# Patient Record
Sex: Female | Born: 1990 | Race: White | Hispanic: Yes | Marital: Single | Smoking: Never smoker
Health system: Southern US, Community
[De-identification: ages and names within clinical notes are randomized; demographics above are authoritative.]

## PROBLEM LIST (undated history)

## (undated) ENCOUNTER — Inpatient Hospital Stay (HOSPITAL_COMMUNITY): Payer: Self-pay

## (undated) DIAGNOSIS — G43909 Migraine, unspecified, not intractable, without status migrainosus: Secondary | ICD-10-CM

## (undated) DIAGNOSIS — N871 Moderate cervical dysplasia: Secondary | ICD-10-CM

## (undated) DIAGNOSIS — R87610 Atypical squamous cells of undetermined significance on cytologic smear of cervix (ASC-US): Principal | ICD-10-CM

## (undated) DIAGNOSIS — I7300 Raynaud's syndrome without gangrene: Secondary | ICD-10-CM

## (undated) DIAGNOSIS — R87810 Cervical high risk human papillomavirus (HPV) DNA test positive: Principal | ICD-10-CM

## (undated) HISTORY — DX: Raynaud's syndrome without gangrene: I73.00

## (undated) HISTORY — DX: Cervical high risk human papillomavirus (HPV) DNA test positive: R87.810

## (undated) HISTORY — PX: NO PAST SURGERIES: SHX2092

## (undated) HISTORY — DX: Migraine, unspecified, not intractable, without status migrainosus: G43.909

## (undated) HISTORY — DX: Moderate cervical dysplasia: N87.1

## (undated) HISTORY — DX: Atypical squamous cells of undetermined significance on cytologic smear of cervix (ASC-US): R87.610

---
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
## 2013-10-18 ENCOUNTER — Ambulatory Visit: Payer: Self-pay | Attending: Internal Medicine

## 2013-11-15 ENCOUNTER — Other Ambulatory Visit (HOSPITAL_COMMUNITY)
Admission: RE | Admit: 2013-11-15 | Discharge: 2013-11-15 | Disposition: A | Discharge status: Home / Self Care | Payer: No Typology Code available for payment source | Source: Ambulatory Visit | Attending: Internal Medicine | Admitting: Internal Medicine

## 2013-11-15 ENCOUNTER — Ambulatory Visit: Payer: No Typology Code available for payment source | Attending: Internal Medicine | Admitting: Internal Medicine

## 2013-11-15 ENCOUNTER — Encounter: Payer: Self-pay | Admitting: Internal Medicine

## 2013-11-15 VITALS — BP 126/87 | HR 80 | Temp 99.0°F | Ht 67.0 in | Wt 135.4 lb

## 2013-11-15 DIAGNOSIS — N898 Other specified noninflammatory disorders of vagina: Secondary | ICD-10-CM

## 2013-11-15 DIAGNOSIS — Z01419 Encounter for gynecological examination (general) (routine) without abnormal findings: Secondary | ICD-10-CM | POA: Insufficient documentation

## 2013-11-15 DIAGNOSIS — Z113 Encounter for screening for infections with a predominantly sexual mode of transmission: Secondary | ICD-10-CM | POA: Insufficient documentation

## 2013-11-15 DIAGNOSIS — G43101 Migraine with aura, not intractable, with status migrainosus: Secondary | ICD-10-CM | POA: Insufficient documentation

## 2013-11-15 DIAGNOSIS — R109 Unspecified abdominal pain: Secondary | ICD-10-CM

## 2013-11-15 DIAGNOSIS — G43909 Migraine, unspecified, not intractable, without status migrainosus: Secondary | ICD-10-CM

## 2013-11-15 DIAGNOSIS — N760 Acute vaginitis: Secondary | ICD-10-CM | POA: Insufficient documentation

## 2013-11-15 LAB — TSH: TSH: 3.645 u[IU]/mL (ref 0.350–4.500)

## 2013-11-15 LAB — CBC WITH DIFFERENTIAL/PLATELET
Basophils Absolute: 0 10*3/uL (ref 0.0–0.1)
Eosinophils Absolute: 0.1 10*3/uL (ref 0.0–0.7)
Eosinophils Relative: 2 % (ref 0–5)
HCT: 42.9 % (ref 36.0–46.0)
Lymphocytes Relative: 26 % (ref 12–46)
Lymphs Abs: 2 10*3/uL (ref 0.7–4.0)
MCH: 30.7 pg (ref 26.0–34.0)
MCV: 87.9 fL (ref 78.0–100.0)
Monocytes Absolute: 0.6 10*3/uL (ref 0.1–1.0)
RDW: 13.3 % (ref 11.5–15.5)
WBC: 8 10*3/uL (ref 4.0–10.5)

## 2013-11-15 LAB — CMP AND LIVER
BUN: 13 mg/dL (ref 6–23)
Bilirubin, Direct: 0.1 mg/dL (ref 0.0–0.3)
CO2: 26 mEq/L (ref 19–32)
Calcium: 9.7 mg/dL (ref 8.4–10.5)
Chloride: 102 mEq/L (ref 96–112)
Creat: 0.82 mg/dL (ref 0.50–1.10)
Total Bilirubin: 0.5 mg/dL (ref 0.3–1.2)

## 2013-11-15 MED ORDER — SUMATRIPTAN SUCCINATE 25 MG PO TABS: 25.0000 mg | ORAL_TABLET | ORAL | Status: DC | PRN

## 2013-11-15 MED ORDER — METRONIDAZOLE 500 MG PO TABS: 2000.0000 mg | ORAL_TABLET | Freq: Once | ORAL | Status: DC

## 2013-11-15 MED ORDER — OMEPRAZOLE 20 MG PO CPDR: 20.0000 mg | DELAYED_RELEASE_CAPSULE | Freq: Every day | ORAL | Status: DC

## 2013-11-15 MED ORDER — FLUCONAZOLE 150 MG PO TABS: 150.0000 mg | ORAL_TABLET | Freq: Once | ORAL | Status: DC

## 2013-11-15 NOTE — Patient Instructions (Signed)
Migraine Headache A migraine headache is an intense, throbbing pain on one or both sides of your head. A migraine can last for 30 minutes to several hours. CAUSES  The exact cause of a migraine headache is not always known. However, a migraine may be caused when nerves in the brain become irritated and release chemicals that cause inflammation. This causes pain. SYMPTOMS  Pain on one or both sides of your head.  Pulsating or throbbing pain.  Severe pain that prevents daily activities.  Pain that is aggravated by any physical activity.  Nausea, vomiting, or both.  Dizziness.  Pain with exposure to bright lights, loud noises, or activity.  General sensitivity to bright lights, loud noises, or smells. Before you get a migraine, you may get warning signs that a migraine is coming (aura). An aura may include:  Seeing flashing lights.  Seeing bright spots, halos, or zig-zag lines.  Having tunnel vision or blurred vision.  Having feelings of numbness or tingling.  Having trouble talking.  Having muscle weakness. MIGRAINE TRIGGERS  Alcohol.  Smoking.  Stress.  Menstruation.  Aged cheeses.  Foods or drinks that contain nitrates, glutamate, aspartame, or tyramine.  Lack of sleep.  Chocolate.  Caffeine.  Hunger.  Physical exertion.  Fatigue.  Medicines used to treat chest pain (nitroglycerine), birth control pills, estrogen, and some blood pressure medicines. DIAGNOSIS  A migraine headache is often diagnosed based on:  Symptoms.  Physical examination.  A CT scan or MRI of your head. TREATMENT Medicines may be given for pain and nausea. Medicines can also be given to help prevent recurrent migraines.  HOME CARE INSTRUCTIONS  Only take over-the-counter or prescription medicines for pain or discomfort as directed by your caregiver. The use of long-term narcotics is not recommended.  Lie down in a dark, quiet room when you have a migraine.  Keep a journal  to find out what may trigger your migraine headaches. For example, write down:  What you eat and drink.  How much sleep you get.  Any change to your diet or medicines.  Limit alcohol consumption.  Quit smoking if you smoke.  Get 7 to 9 hours of sleep, or as recommended by your caregiver.  Limit stress.  Keep lights dim if bright lights bother you and make your migraines worse. SEEK IMMEDIATE MEDICAL CARE IF:   Your migraine becomes severe.  You have a fever.  You have a stiff neck.  You have vision loss.  You have muscular weakness or loss of muscle control.  You start losing your balance or have trouble walking.  You feel faint or pass out.  You have severe symptoms that are different from your first symptoms. MAKE SURE YOU:   Understand these instructions.  Will watch your condition.  Will get help right away if you are not doing well or get worse. Document Released: 12/16/2005 Document Revised: 03/09/2012 Document Reviewed: 12/06/2011 Exit[employer] Patient Information 2014 [employer].  

## 2013-11-15 NOTE — Progress Notes (Signed)
Patient ID: Barbara Mcfarland, female   DOB: 05-24-1991, 22 y.o.   MRN: [id number] Patient Demographics  Barbara Mcfarland, is a 22 y.o. female  [id number]:[id number]  [id number]:[id number]  DOB - 04-Oct-1991  CC:  Chief Complaint  Patient presents with  . Establish Care  . Headaches    x3 day since birth, migranes, blurred vision       HPI: Barbara Mcfarland is a 22 y.o. female here today to establish medical care. Patient complains of headache that has been going on since childhood. She said the headache is on and off, sometimes once in a month, throbbing, associated with photophobia, changes position, not related to her menstrual period. She also has abdominal pain on and off, worse, she is hungry. She has some vagina discharge with unusual odor. Claims she's not sexually active. She does not smoke cigarette, she does not drink alcohol.  Patient has No headache, No chest pain, No abdominal pain - No Nausea, No new weakness tingling or numbness, No Cough - SOB.  Not on File No past medical history on file. No current outpatient prescriptions on file prior to visit.   No current facility-administered medications on file prior to visit.   No family history on file. History   Social History  . Marital Status: Single    Spouse Name: N/A    Number of Children: N/A  . Years of Education: N/A   Occupational History  . Not on file.   Social History Main Topics  . Smoking status: Never Smoker   . Smokeless tobacco: Not on file  . Alcohol Use: Not on file  . Drug Use: Not on file  . Sexual Activity: Not on file   Other Topics Concern  . Not on file   Social History Narrative  . No narrative on file    Review of Systems: Constitutional: Negative for fever, chills, diaphoresis, activity change, appetite change and fatigue. HENT: Negative for ear pain, nosebleeds, congestion, facial swelling, rhinorrhea, neck pain, neck stiffness and ear discharge.  Eyes: Negative for pain, discharge, redness, itching and  visual disturbance. Respiratory: Negative for cough, choking, chest tightness, shortness of breath, wheezing and stridor.  Cardiovascular: Negative for chest pain, palpitations and leg swelling. Gastrointestinal: Negative for abdominal distention. Genitourinary: Negative for dysuria, urgency, frequency, hematuria, flank pain, decreased urine volume, difficulty urinating and dyspareunia.  Musculoskeletal: Negative for back pain, joint swelling, arthralgia and gait problem. Neurological: Negative for dizziness, tremors, seizures, syncope, facial asymmetry, speech difficulty, weakness, light-headedness, numbness and headaches.  Hematological: Negative for adenopathy. Does not bruise/bleed easily. Psychiatric/Behavioral: Negative for hallucinations, behavioral problems, confusion, dysphoric mood, decreased concentration and agitation.    Objective:   Filed Vitals:   11/15/13 1414  BP: 126/87  Pulse: 80  Temp: 99 F (37.2 C)    Physical Exam: Constitutional: Patient appears well-developed and well-nourished. No distress. HENT: Normocephalic, atraumatic, External right and left ear normal. Oropharynx is clear and moist.  Eyes: Conjunctivae and EOM are normal. PERRLA, no scleral icterus. Neck: Normal ROM. Neck supple. No JVD. No tracheal deviation. No thyromegaly. CVS: RRR, S1/S2 +, no murmurs, no gallops, no carotid bruit.  Pulmonary: Effort and breath sounds normal, no stridor, rhonchi, wheezes, rales.  Abdominal: Soft. BS +, no distension, tenderness, rebound or guarding.  Musculoskeletal: Normal range of motion. No edema and no tenderness.  Lymphadenopathy: No lymphadenopathy noted, cervical, inguinal or axillary Neuro: Alert. Normal reflexes, muscle tone coordination. No cranial nerve deficit. Skin: Skin is warm and dry. No rash noted.  Not diaphoretic. No erythema. No pallor. Psychiatric: Normal mood and affect. Behavior, judgment, thought content normal.  Lab Results  Component  Value Date   WBC 6.7 07/15/2008   HGB 14.4 07/15/2008   HCT 42.5 07/15/2008   MCV 89.3 07/15/2008   PLT 227 07/15/2008   Lab Results  Component Value Date   CREATININE 0.58 07/15/2008   BUN 12 07/15/2008   NA 140 07/15/2008   K 3.9 07/15/2008   CL 103 07/15/2008   CO2 21 07/15/2008    No results found for this basename: HGBA1C   Lipid Panel  No results found for this basename: chol, trig, hdl, cholhdl, vldl, ldlcalc       Assessment and plan:   Patient Active Problem List   Diagnosis Date Noted  . Migraine headache 11/15/2013  . Abdominal pain, unspecified site 11/15/2013  . Vaginal discharge 11/15/2013    Plan: Pap smear done and sent for cytology and ancillary tests  We will sumatriptan 25 mg tablet by mouth once, repeat in 2 hours only. Prilosec 20 mg capsule by mouth daily Empiric treatment for bacterial vaginosis with metronidazole tablet 2 g once by mouth Empiric treatment for vaginal candidiasis with Diflucan 150 mg tablet by mouth once  Patient extensively counseled about nutrition and exercise   Follow up in 4 weeks or when necessary   The patient was given clear instructions to go to ER or return to medical center if symptoms don't improve, worsen or new problems develop. The patient verbalized understanding. The patient was told to call to get lab results if they haven't heard anything in the next week.     Barbara Lewandowsky, MD, MHA, FACP, FAAP [facility] [place], [place] [phone]   11/15/2013, 2:58 PM

## 2013-11-15 NOTE — Progress Notes (Signed)
Pt is here to establish care. Pt is complaining of migraine headaches x3 day since birth; causing blurred vision.

## 2013-11-16 LAB — URINALYSIS, COMPLETE
Hgb urine dipstick: NEGATIVE
Leukocytes, UA: NEGATIVE
Nitrite: NEGATIVE
Protein, ur: NEGATIVE mg/dL
Urobilinogen, UA: 0.2 mg/dL (ref 0.0–1.0)

## 2013-12-03 ENCOUNTER — Ambulatory Visit: Payer: No Typology Code available for payment source | Attending: Internal Medicine | Admitting: Internal Medicine

## 2013-12-03 VITALS — BP 134/69 | HR 77 | Temp 98.8°F | Resp 14 | Ht 67.0 in | Wt 139.6 lb

## 2013-12-03 DIAGNOSIS — G43909 Migraine, unspecified, not intractable, without status migrainosus: Secondary | ICD-10-CM | POA: Insufficient documentation

## 2013-12-03 NOTE — Progress Notes (Signed)
Patient ID: Barbara Mcfarland, female   DOB: December 25, 1991, 22 y.o.   MRN: [id number]   CC:  HPI: 22 year old female who is here for followup of her migraine headaches. She has been using sumatriptan with good response and she has not had any headaches for a long time She is concerned about not being able to get pregnant. She states that her menstrual cycle is regular. Her Pap smear was also negative and the results of the Pap smear would discussed with her She also was treated for vaginitis, most of her symptoms have resolved she denies any foul-smelling discharge or any vaginal itching     Not on File No past medical history on file. Current Outpatient Prescriptions on File Prior to Visit  Medication Sig Dispense Refill  . metroNIDAZOLE (FLAGYL) 500 MG tablet Take 4 tablets (2,000 mg total) by mouth once.  4 tablet  0  . omeprazole (PRILOSEC) 20 MG capsule Take 1 capsule (20 mg total) by mouth daily.  30 capsule  3  . fluconazole (DIFLUCAN) 150 MG tablet Take 1 tablet (150 mg total) by mouth once.  1 tablet  0  . SUMAtriptan (IMITREX) 25 MG tablet Take 1 tablet (25 mg total) by mouth every 2 (two) hours as needed for migraine or headache. May repeat in 2 hours if headache persists or recurs.  10 tablet  0   No current facility-administered medications on file prior to visit.   No family history on file. History   Social History  . Marital Status: Single    Spouse Name: N/A    Number of Children: N/A  . Years of Education: N/A   Occupational History  . Not on file.   Social History Main Topics  . Smoking status: Never Smoker   . Smokeless tobacco: Not on file  . Alcohol Use: Not on file  . Drug Use: Not on file  . Sexual Activity: Not on file   Other Topics Concern  . Not on file   Social History Narrative  . No narrative on file    Review of Systems  Constitutional: Negative for fever, chills, diaphoresis, activity change, appetite change and fatigue.  HENT: Negative for ear  pain, nosebleeds, congestion, facial swelling, rhinorrhea, neck pain, neck stiffness and ear discharge.   Eyes: Negative for pain, discharge, redness, itching and visual disturbance.  Respiratory: Negative for cough, choking, chest tightness, shortness of breath, wheezing and stridor.   Cardiovascular: Negative for chest pain, palpitations and leg swelling.  Gastrointestinal: Negative for abdominal distention.  Genitourinary: Negative for dysuria, urgency, frequency, hematuria, flank pain, decreased urine volume, difficulty urinating and dyspareunia.  Musculoskeletal: Negative for back pain, joint swelling, arthralgias and gait problem.  Neurological: Negative for dizziness, tremors, seizures, syncope, facial asymmetry, speech difficulty, weakness, light-headedness, numbness and headaches.  Hematological: Negative for adenopathy. Does not bruise/bleed easily.  Psychiatric/Behavioral: Negative for hallucinations, behavioral problems, confusion, dysphoric mood, decreased concentration and agitation.    Objective:   Filed Vitals:   12/03/13 1422  BP: 134/69  Pulse: 77  Temp: 98.8 F (37.1 C)  Resp: 14    Physical Exam  Constitutional: Appears well-developed and well-nourished. No distress.  HENT: Normocephalic. External right and left ear normal. Oropharynx is clear and moist.  Eyes: Conjunctivae and EOM are normal. PERRLA, no scleral icterus.  Neck: Normal ROM. Neck supple. No JVD. No tracheal deviation. No thyromegaly.  CVS: RRR, S1/S2 +, no murmurs, no gallops, no carotid bruit.  Pulmonary: Effort and breath sounds normal,  no stridor, rhonchi, wheezes, rales.  Abdominal: Soft. BS +,  no distension, tenderness, rebound or guarding.  Musculoskeletal: Normal range of motion. No edema and no tenderness.  Lymphadenopathy: No lymphadenopathy noted, cervical, inguinal. Neuro: Alert. Normal reflexes, muscle tone coordination. No cranial nerve deficit. Skin: Skin is warm and dry. No rash  noted. Not diaphoretic. No erythema. No pallor.  Psychiatric: Normal mood and affect. Behavior, judgment, thought content normal.   Lab Results  Component Value Date   WBC 8.0 11/15/2013   HGB 15.0 11/15/2013   HCT 42.9 11/15/2013   MCV 87.9 11/15/2013   PLT 280 11/15/2013   Lab Results  Component Value Date   CREATININE 0.82 11/15/2013   BUN 13 11/15/2013   NA 136 11/15/2013   K 4.1 11/15/2013   CL 102 11/15/2013   CO2 26 11/15/2013    No results found for this basename: HGBA1C   Lipid Panel  No results found for this basename: chol, trig, hdl, cholhdl, vldl, ldlcalc       Assessment and plan:   Patient Active Problem List   Diagnosis Date Noted  . Migraine headache 11/15/2013  . Abdominal pain, unspecified site 11/15/2013  . Vaginal discharge 11/15/2013       Vaginitis-resolved  Migraine headaches improved  Gynecologic referral for inability to get pregnant We'll check TSH and prolactin level  Can followup in 9 months to one year, routine followup  The patient was given clear instructions to go to ER or return to medical center if symptoms don't improve, worsen or new problems develop. The patient verbalized understanding. The patient was told to call to get any lab results if not heard anything in the next week.

## 2013-12-03 NOTE — Progress Notes (Signed)
Pt is here for a 4 week f/u from last visit. Requesting results from that visit. Medication for migraines has helped a lot.

## 2013-12-04 LAB — PROLACTIN: Prolactin: 15 ng/mL

## 2013-12-13 ENCOUNTER — Ambulatory Visit: Payer: No Typology Code available for payment source | Admitting: Internal Medicine

## 2014-07-06 ENCOUNTER — Other Ambulatory Visit: Payer: Self-pay | Admitting: Internal Medicine

## 2015-02-10 ENCOUNTER — Ambulatory Visit: Payer: Self-pay | Attending: Internal Medicine

## 2015-05-05 ENCOUNTER — Ambulatory Visit: Payer: Self-pay | Attending: Internal Medicine | Admitting: Internal Medicine

## 2015-05-05 ENCOUNTER — Encounter: Payer: Self-pay | Admitting: Internal Medicine

## 2015-05-05 VITALS — BP 132/93 | HR 83 | Temp 98.2°F | Resp 16 | Ht 66.0 in | Wt 147.0 lb

## 2015-05-05 DIAGNOSIS — L679 Hair color and hair shaft abnormality, unspecified: Secondary | ICD-10-CM

## 2015-05-05 DIAGNOSIS — G43009 Migraine without aura, not intractable, without status migrainosus: Secondary | ICD-10-CM | POA: Insufficient documentation

## 2015-05-05 DIAGNOSIS — L659 Nonscarring hair loss, unspecified: Secondary | ICD-10-CM | POA: Insufficient documentation

## 2015-05-05 MED ORDER — SUMATRIPTAN SUCCINATE 25 MG PO TABS: 25.0000 mg | ORAL_TABLET | Freq: Three times a day (TID) | ORAL | Status: DC | PRN

## 2015-05-05 MED ORDER — TOPIRAMATE 25 MG PO TABS: 25.0000 mg | ORAL_TABLET | Freq: Every day | ORAL | Status: DC

## 2015-05-05 NOTE — Progress Notes (Signed)
Patient ID: Barbara Mcfarland, female   DOB: 01/30/1991, 24 y.o.   MRN: [id number]  CC: hair changes   HPI: Barbara Mcfarland is a 24 y.o. female here today for a follow up visit.  Patient has no significant past medical history and is not on any current medications. She presents for evaluation of hair loss and grey hair. She has noticed this problem over the past 2-3 months. She states that in the spots that she has hair loss, it grows back grey. The hair loss patch starts outs as small bumps that were pruritic and then the hair loss shortly follows. She states that in the past few months she was given fluconazole for tinea versicolor and believes the hair loss may be related to that.   Her brother suffered hair loss at 7428 but it grew back its natural color. She denies any early greying of her family members.   Patient has No chest pain, No abdominal pain - No Nausea, No new weakness tingling or numbness, No Cough - SOB.  No Known Allergies History reviewed. No pertinent past medical history. No current outpatient prescriptions on file prior to visit.   No current facility-administered medications on file prior to visit.   History reviewed. No pertinent family history. History   Social History  . Marital Status: Single    Spouse Name: N/A  . Number of Children: N/A  . Years of Education: N/A   Occupational History  . Not on file.   Social History Main Topics  . Smoking status: Never Smoker   . Smokeless tobacco: Not on file  . Alcohol Use: Not on file  . Drug Use: Not on file  . Sexual Activity: Not on file   Other Topics Concern  . Not on file   Social History Narrative    Review of Systems: See HPI   Objective:   Filed Vitals:   05/05/15 1015  BP: 132/93  Pulse: 83  Temp: 98.2 F (36.8 C)  Resp: 16    Physical Exam  Constitutional: She is oriented to person, place, and time.  HENT:  Head:    Eyes: EOM are normal. Pupils are equal, round, and reactive to light.    Cardiovascular: Normal rate and regular rhythm.   Pulmonary/Chest: Effort normal and breath sounds normal.  Neurological: She is alert and oriented to person, place, and time. No cranial nerve deficit.  '  Lab Results  Component Value Date   WBC 8.0 11/15/2013   HGB 15.0 11/15/2013   HCT 42.9 11/15/2013   MCV 87.9 11/15/2013   PLT 280 11/15/2013   Lab Results  Component Value Date   CREATININE 0.82 11/15/2013   BUN 13 11/15/2013   NA 136 11/15/2013   K 4.1 11/15/2013   CL 102 11/15/2013   CO2 26 11/15/2013    No results found for: HGBA1C Lipid Panel  No results found for: CHOL, TRIG, HDL, CHOLHDL, VLDL, LDLCALC     Assessment and plan:  Ellwood HandlerSaray was seen today for establish care.  Diagnoses and all orders for this visit:  Hair loss Orders: -     Ambulatory referral to Dermatology Patches of hair appear to have once been a fungal infection but does not explain the color changes  Hair changes Orders: -     Ambulatory referral to Dermatology  Migraine without aura and without status migrainosus, not intractable Orders: -     Begin SUMAtriptan (IMITREX) 25 MG tablet; Take 1 tablet (25 mg total)  by mouth every 8 (eight) hours as needed for migraine or headache. May repeat in 2 hours if headache persists or recurs. -     Begin topiramate (TOPAMAX) 25 MG tablet; Take 1 tablet (25 mg total) by mouth at bedtime. Patient will complete headache diary and bring back to follow up visit. At that time I plan to increase topamax to BID if needed.   Return in about 6 weeks (around 06/16/2015) for migraines.       Holland CommonsKECK, VALERIE, NP-C [facility] [phone] 05/05/2015, 10:32 AM

## 2015-05-05 NOTE — Progress Notes (Signed)
Pt is here b/c she has been loosing hair and turning grey.

## 2016-02-20 ENCOUNTER — Encounter: Payer: Self-pay | Admitting: Internal Medicine

## 2016-02-20 ENCOUNTER — Ambulatory Visit (INDEPENDENT_AMBULATORY_CARE_PROVIDER_SITE_OTHER): Payer: Self-pay | Admitting: Internal Medicine

## 2016-02-20 VITALS — BP 124/76 | HR 66 | Ht 66.0 in | Wt 143.0 lb

## 2016-02-20 DIAGNOSIS — I7300 Raynaud's syndrome without gangrene: Secondary | ICD-10-CM

## 2016-02-20 DIAGNOSIS — G43809 Other migraine, not intractable, without status migrainosus: Secondary | ICD-10-CM

## 2016-02-20 DIAGNOSIS — M76891 Other specified enthesopathies of right lower limb, excluding foot: Secondary | ICD-10-CM

## 2016-02-20 DIAGNOSIS — G43009 Migraine without aura, not intractable, without status migrainosus: Secondary | ICD-10-CM

## 2016-02-20 DIAGNOSIS — M65851 Other synovitis and tenosynovitis, right thigh: Secondary | ICD-10-CM

## 2016-02-20 DIAGNOSIS — Z23 Encounter for immunization: Secondary | ICD-10-CM

## 2016-02-20 HISTORY — DX: Raynaud's syndrome without gangrene: I73.00

## 2016-02-20 MED ORDER — VERAPAMIL HCL 80 MG PO TABS: ORAL_TABLET | ORAL | Status: DC

## 2016-02-20 MED ORDER — SUMATRIPTAN SUCCINATE 50 MG PO TABS: ORAL_TABLET | ORAL | Status: DC

## 2016-02-20 NOTE — Progress Notes (Signed)
Subjective:    Patient ID: Barbara Mcfarland, female    DOB: 1991/10/30, 25 y.o.   MRN: [id number]  HPI  1.  Pain like someone is pulling my tendon from left "butt cheek".  If leg locked and turning a bit to right, feels the pull and then the pain.  When sits in a hard seat, feels like leg is falling asleep.  This has been a problem for 6-7 months.  States she had started working out a while before the pain began. Works at [employer] and is walking around a lot, bending to pack parts in tubs, etc.  Causes her pain.  Has been working there beginning of October.  Already had pain, does not feel any worse than before she started there. Does not feel weakness in the leg or foot.  2.  Headaches:  Has had since a child.  Can be frontal, nuchal, temporal.  Generally starts on the left side, but can be on the right.  Feels like someone is hitting her in the head with a hammer.   No definite aura, but can feel light headed before a headache starts. + Phonophobia and Photophobia. + Nausea and vomiting when bad.   Sumatriptan 25 mg just puts her to sleep, but may be confusing with Topamax, which she only uses when she has a headache at night.  Was not aware she should take every night.   Has never taken every night.  Has a headache daily.  Feels she gets migraines 4 days out of 5.  Gets them on weekends also.    Has a lot of stressors:  Mom somewhat controlling/strict.  She is not allowed to leave the home until she gets married.  Her older brothers have the same rules.  Patient is second from youngest.  Sister is Barbara Mcfarland, a patient here.  Rutherford Nail is the youngest.  Feels she is living in a bubble.  Mother tells her to "forget about her"  If she talks about moving out on her own.  Does have a friend she can room with.   Mother requires her to take her sister wherever she goes for errands or for fun Mother is a patient here as well.  3.  ?Raynaud's Phenomenon:  Fingers get cold and  purplish for 3-4 years.  Cold rainy days or cooler or if holds a cold drink.  Never sees fingers blanche white, but when they warm up, they turn beefy red.   4.  Fatigued:  Exercises at 9:30 p.m. And then cannot fall asleep until 12 or 1 a.m.  Gets up at 9:30 am.  Works until 7:30 pm  Wants to bring up heartburn--cover at next visit.  Med:   Sumatriptan 25 mg 1 tab as needed for Headache:  Currently only taking at night when has headache?--confused regarding meds  Tompiramate 25 mg only taking 1 tab at bedtime when has a headache.  Allergies:  NKDA      Review of Systems     Objective:   Physical Exam   NAD HEENT:  PERRL, EOMI, discs sharp, TMs pearly gray, throat without injection Neck:  Supple, No adenopathy, no thyromegaly Chest:  CTA CV:  RRR with normal S1 and S2, No S3, S4 or murmur.  Radial and DP pulse normal and equal Abd:  S, NT, +BS, No HSM or mass Extrems:  Fingertips cool and clammy with mild cyanosis at base of nails.  No loss of tissue distally. MS:  Point tenderness on ischial tuberosity, left.  Reproduces discomfort down leg. Not tender to lateral aspect of tuberosity Negative straight leg raise.  Stretch of hamstrings is felt in same area. Neuro: A & O x3, CN II-XII grossly intact, DTRs 2+/4 throughout, Motor is 5/5 throughout, sensory grossly normal when stands, initial mild limp.           Assessment & Plan:  1.  Ischial tuberosity bursitis/Hamstring tendintis:  PT, exercises given.  Needs to get orange card for PT.  2.  Migraines: Almost daily by history.  Start Verapamil 40 mg twice daily--consider going back to Topiramate and taking regularly if does not tolerate. BP check in 1 week.  Increase Sumatriptan to 50 mg for a migraine with max of 200 mg in a day. Discussed not waiting until her pain is significant  3.  Raynaud's Phenomenon:  May get some benefit from Verapamil, though not a peripherally acting Calcium channel blocker.  To keep her core  warm, not just her hands.

## 2016-02-20 NOTE — Patient Instructions (Addendum)
Please keep track of your headaches--write down on a calendar when you have one and for how long, and what you took to control  Stop Topiramate for now--do not throw out  Stretches twice daily of left hamstrings--10 reps and hold for 10 counts

## 2016-02-29 ENCOUNTER — Other Ambulatory Visit: Payer: Self-pay | Admitting: Licensed Clinical Social Worker

## 2016-03-01 ENCOUNTER — Ambulatory Visit: Payer: Self-pay

## 2016-03-01 VITALS — BP 108/70 | HR 62

## 2016-03-01 DIAGNOSIS — G43809 Other migraine, not intractable, without status migrainosus: Secondary | ICD-10-CM

## 2016-04-25 ENCOUNTER — Ambulatory Visit: Payer: Self-pay | Admitting: Internal Medicine

## 2016-12-30 NOTE — L&D Delivery Note (Addendum)
26 y.o. G1P0 at 4711w4d delivered a viable female infant in cephalic, OA position with moderate meconium stained fluid. No nuchal cord. Anterior shoulder delivered with ease. 60 sec delayed cord clamping. Cord clamped x2 and cut. Placenta delivered spontaneously intact, with 3VC. Fundus firm on exam with massage and pitocin. Good hemostasis noted.  Anesthesia: None; Lidocaine for local anesthesia Laceration: 3rd degree, type 3C (complete tear of EAS), repaired in typical fashion. Suture: 2-0 Monocryl, 3-0 Vicryl Good hemostasis noted. EBL: 400cc  Mom and baby recovering in LDR.    Apgars: APGAR (1 MIN): 9   APGAR (5 MINS): 9     Weight: Pending skin to skin  Sponge and instrument count were correct x2. Placenta sent to L&D.  Dr. Nettie ElmMichael Ervin present for entire 3rd degree repair.   Barbara MowElizabeth Amil Bouwman, DO OB Fellow [facility] 06/21/2017, 9:46 AM

## 2017-01-02 LAB — OB RESULTS CONSOLE RUBELLA ANTIBODY, IGM: RUBELLA: IMMUNE

## 2017-01-02 LAB — OB RESULTS CONSOLE GC/CHLAMYDIA
CHLAMYDIA, DNA PROBE: NEGATIVE
GC PROBE AMP, GENITAL: NEGATIVE

## 2017-01-02 LAB — OB RESULTS CONSOLE RPR: RPR: NONREACTIVE

## 2017-01-02 LAB — OB RESULTS CONSOLE HEPATITIS B SURFACE ANTIGEN: Hepatitis B Surface Ag: NEGATIVE

## 2017-01-02 LAB — OB RESULTS CONSOLE HIV ANTIBODY (ROUTINE TESTING): HIV: NONREACTIVE

## 2017-01-21 ENCOUNTER — Encounter (HOSPITAL_COMMUNITY): Payer: Self-pay | Admitting: *Deleted

## 2017-01-21 ENCOUNTER — Inpatient Hospital Stay (HOSPITAL_COMMUNITY)
Admission: AD | Admit: 2017-01-21 | Discharge: 2017-01-21 | Disposition: A | Discharge status: Home / Self Care | Payer: Self-pay | Source: Ambulatory Visit | Attending: Family Medicine | Admitting: Family Medicine

## 2017-01-21 DIAGNOSIS — O2342 Unspecified infection of urinary tract in pregnancy, second trimester: Secondary | ICD-10-CM | POA: Insufficient documentation

## 2017-01-21 DIAGNOSIS — Z3A19 19 weeks gestation of pregnancy: Secondary | ICD-10-CM | POA: Insufficient documentation

## 2017-01-21 LAB — URINALYSIS, ROUTINE W REFLEX MICROSCOPIC
Bilirubin Urine: NEGATIVE
GLUCOSE, UA: NEGATIVE mg/dL
Ketones, ur: NEGATIVE mg/dL
Nitrite: NEGATIVE
PROTEIN: NEGATIVE mg/dL
Specific Gravity, Urine: 1.002 — ABNORMAL LOW (ref 1.005–1.030)
pH: 7 (ref 5.0–8.0)

## 2017-01-21 LAB — WET PREP, GENITAL
CLUE CELLS WET PREP: NONE SEEN
Sperm: NONE SEEN
TRICH WET PREP: NONE SEEN
Yeast Wet Prep HPF POC: NONE SEEN

## 2017-01-21 MED ORDER — NITROFURANTOIN MONOHYD MACRO 100 MG PO CAPS: 100.0000 mg | ORAL_CAPSULE | Freq: Two times a day (BID) | ORAL | 0 refills | Status: DC

## 2017-01-21 NOTE — Discharge Instructions (Signed)
Pregnancy and Urinary Tract Infection °WHAT IS A URINARY TRACT INFECTION? °A urinary tract infection (UTI) is an infection of any part of the urinary tract. This includes the kidneys, the tubes that connect your kidneys to your bladder (ureters), the bladder, and the tube that carries urine out of your body (urethra). These organs make, store, and get rid of urine in the body. A UTI can be a bladder infection (cystitis) or a kidney infection (pyelonephritis). This infection may be caused by fungi, viruses, and bacteria. Bacteria are the most common cause of UTIs. °You are more likely to develop a UTI during pregnancy because: °· The physical and hormonal changes your body goes through can make it easier for bacteria to get into your urinary tract. °· Your growing baby puts pressure on your uterus and can affect urine flow. °DOES A UTI PLACE MY BABY AT RISK? °An untreated UTI during pregnancy could lead to a kidney infection, which can cause health problems that could affect your baby. Possible complications of an untreated UTI include: °· Having your baby before 37 weeks of pregnancy (premature). °· Having a baby with a low birth weight. °· Developing high blood pressure during pregnancy (preeclampsia). °WHAT ARE THE SYMPTOMS OF A UTI? °Symptoms of a UTI include: °· Fever. °· Frequent urination or passing small amounts of urine frequently. °· Needing to urinate urgently. °· Pain or a burning sensation with urination. °· Urine that smells bad or unusual. °· Cloudy urine. °· Pain in the lower abdomen or back. °· Trouble urinating. °· Blood in the urine. °· Vomiting or being less hungry than normal. °· Diarrhea or abdominal pain. °· Vaginal discharge. °WHAT ARE THE TREATMENT OPTIONS FOR A UTI DURING PREGNANCY? °Treatment for this condition may include: °· Antibiotic medicines that are safe to take during pregnancy. °· Other medicines to treat less common causes of UTI. °HOW CAN I PREVENT A UTI? °To prevent a UTI: °· Go  to the bathroom as soon as you feel the need. °· Always wipe from front to back. °· Wash your genital area with soap and warm water daily. °· Empty your bladder before and after sex. °· Wear cotton underwear. °· Limit your intake of high sugar foods or drinks, such as regular soda, juice, and sweets.. °· Drink 6-8 glasses of water daily. °· Do not wear tight-fitting pants. °· Do not douche or use deodorant sprays. °· Do not drink alcohol, caffeine, or carbonated drinks. These can irritate the bladder. °WHEN SHOULD I SEEK MEDICAL CARE? °Seek medical care if: °· Your symptoms do not improve or get worse. °· You have a fever after two days of treatment. °· You have a rash. °· You have abnormal vaginal discharge. °· You have back or side pain. °· You have chills. °· You have nausea and vomiting. °WHEN SHOULD I SEEK IMMEDIATE MEDICAL CARE? °Seek immediate medical care if you are pregnant and: °· You feel contractions in your uterus. °· You have lower belly pain. °· You have a gush of fluid from your vagina. °· You have blood in your urine. °· You are vomiting and cannot keep down any medicines or water. °This information is not intended to replace advice given to you by your health care provider. Make sure you discuss any questions you have with your health care provider. °Document Released: 04/12/2011 Document Revised: 05/20/2016 Document Reviewed: 11/06/2015 °Elsevier Interactive Patient Education © 2017 [employer] ° °

## 2017-01-21 NOTE — MAU Note (Signed)
Pt reports lower back pain that started at 1730, pain was really bad to start with but has continued. Pt states she is 19 weeks preg and [facility]. Denies bleeding , dysuria.

## 2017-01-21 NOTE — MAU Provider Note (Signed)
History     CSN: [id number]  Arrival date and time: 01/21/17 1957   None     No chief complaint on file.  G1 @19  weeks here with lower back pain. She describes as constant, sharp, and worse with standing, rates pain 8/10. She also reports one brief episode of LAP 2 days ago. She denies fever. She denies urinary sx. She denies VB. She denies vaginal discharge, itching, or irritation. She is not feeling FM. She is eating and drinking well. She reports 4 bottles of water today.    OB History    Gravida Para Term Preterm AB Living   1         0   SAB TAB Ectopic Multiple Live Births                  Past Medical History:  Diagnosis Date  . Migraine headache from childhood    Past Surgical History:  Procedure Laterality Date  . NO PAST SURGERIES      Family History  Problem Relation Age of Onset  . Asthma Neg Hx   . Cancer Neg Hx   . Diabetes Neg Hx   . Hypertension Neg Hx     Social History  Substance Use Topics  . Smoking status: Never Smoker  . Smokeless tobacco: Never Used  . Alcohol use No    Allergies: No Known Allergies  Prescriptions Prior to Admission  Medication Sig Dispense Refill Last Dose  . Prenatal Vit-Fe Fumarate-FA (PRENATAL MULTIVITAMIN) TABS tablet Take 1 tablet by mouth daily at 12 noon.   Past Week at Unknown time    Review of Systems  Constitutional: Negative for fever.  Gastrointestinal: Positive for abdominal pain.  Genitourinary: Negative.   Musculoskeletal: Positive for back pain.   Physical Exam   Blood pressure 119/68, pulse 80, temperature 98.3 F (36.8 C), temperature source Oral, resp. rate 17, height 5\' 6"  (1.676 m), weight 70.3 kg (155 lb), last menstrual period 09/10/2016, SpO2 100 %.  Physical Exam  Nursing note and vitals reviewed. Constitutional: She is oriented to person, place, and time. She appears well-developed and well-nourished. No distress.  HENT:  Head: Normocephalic and atraumatic.  Neck: Normal range of  motion.  Cardiovascular: Normal rate.   Respiratory: Effort normal.  GI: Soft. She exhibits no distension and no mass. There is no tenderness. There is no rebound, no guarding and no CVA tenderness.  Genitourinary:  Genitourinary Comments: External: no lesions or erythema Vagina: rugated, nulli, thin white discharge Cervix closed/long  Musculoskeletal: Normal range of motion.       Thoracic back: She exhibits no tenderness.       Lumbar back: She exhibits no tenderness.  Neurological: She is alert and oriented to person, place, and time.  Skin: Skin is warm and dry.  Psychiatric: She has a normal mood and affect.   FHT: 148 bpm  Results for orders placed or performed during the hospital encounter of 01/21/17 (from the past 24 hour(s))  Urinalysis, Routine w reflex microscopic     Status: Abnormal   Collection Time: 01/21/17  8:20 PM  Result Value Ref Range   Color, Urine STRAW (A) YELLOW   APPearance CLEAR CLEAR   Specific Gravity, Urine 1.002 (L) 1.005 - 1.030   pH 7.0 5.0 - 8.0   Glucose, UA NEGATIVE NEGATIVE mg/dL   Hgb urine dipstick SMALL (A) NEGATIVE   Bilirubin Urine NEGATIVE NEGATIVE   Ketones, ur NEGATIVE NEGATIVE mg/dL   Protein,  ur NEGATIVE NEGATIVE mg/dL   Nitrite NEGATIVE NEGATIVE   Leukocytes, UA LARGE (A) NEGATIVE   RBC / HPF 0-5 0 - 5 RBC/hpf   WBC, UA 6-30 0 - 5 WBC/hpf   Bacteria, UA MANY (A) NONE SEEN   Squamous Epithelial / LPF 0-5 (A) NONE SEEN  Wet prep, genital     Status: Abnormal   Collection Time: 01/21/17  9:28 PM  Result Value Ref Range   Yeast Wet Prep HPF POC NONE SEEN NONE SEEN   Trich, Wet Prep NONE SEEN NONE SEEN   Clue Cells Wet Prep HPF POC NONE SEEN NONE SEEN   WBC, Wet Prep HPF POC MODERATE (A) NONE SEEN   Sperm NONE SEEN     MAU Course  Procedures  MDM Labs ordered and reviewed. Will treat for UTI, UC pending. Stable for discharge home.  Assessment and Plan   1. [redacted] weeks gestation of pregnancy   2. Urinary tract infection in  mother during second trimester of pregnancy    [facility] Increase water intake- 6 bottles per day Add cranberry juice Tylenol prn Heating pad  Allergies as of 01/21/2017   No Known Allergies     Medication List    TAKE these medications   nitrofurantoin (macrocrystal-monohydrate) 100 MG capsule Commonly known as:  MACROBID Take 1 capsule (100 mg total) by mouth 2 (two) times daily.   prenatal multivitamin Tabs tablet Take 1 tablet by mouth daily at 12 noon.      Barbara Mcfarland, CNM 01/21/2017, 9:21 PM

## 2017-01-22 LAB — GC/CHLAMYDIA PROBE AMP (~~LOC~~) NOT AT ARMC
Chlamydia: NEGATIVE
NEISSERIA GONORRHEA: NEGATIVE

## 2017-01-23 LAB — CULTURE, OB URINE: Culture: NO GROWTH

## 2017-01-24 ENCOUNTER — Encounter (HOSPITAL_COMMUNITY): Payer: Self-pay | Admitting: *Deleted

## 2017-01-24 ENCOUNTER — Inpatient Hospital Stay (HOSPITAL_COMMUNITY)
Admission: AD | Admit: 2017-01-24 | Discharge: 2017-01-24 | Disposition: A | Discharge status: Home / Self Care | Payer: Self-pay | Source: Ambulatory Visit | Attending: Obstetrics & Gynecology | Admitting: Obstetrics & Gynecology

## 2017-01-24 DIAGNOSIS — O26892 Other specified pregnancy related conditions, second trimester: Secondary | ICD-10-CM | POA: Insufficient documentation

## 2017-01-24 DIAGNOSIS — M545 Low back pain, unspecified: Secondary | ICD-10-CM

## 2017-01-24 DIAGNOSIS — G43909 Migraine, unspecified, not intractable, without status migrainosus: Secondary | ICD-10-CM | POA: Insufficient documentation

## 2017-01-24 DIAGNOSIS — Z3A19 19 weeks gestation of pregnancy: Secondary | ICD-10-CM | POA: Insufficient documentation

## 2017-01-24 LAB — URINALYSIS, ROUTINE W REFLEX MICROSCOPIC
Bilirubin Urine: NEGATIVE
GLUCOSE, UA: NEGATIVE mg/dL
HGB URINE DIPSTICK: NEGATIVE
KETONES UR: NEGATIVE mg/dL
NITRITE: NEGATIVE
PH: 7 (ref 5.0–8.0)
PROTEIN: NEGATIVE mg/dL
Specific Gravity, Urine: 1.012 (ref 1.005–1.030)

## 2017-01-24 MED ORDER — CYCLOBENZAPRINE HCL 10 MG PO TABS: 10.0000 mg | ORAL_TABLET | Freq: Three times a day (TID) | ORAL | 1 refills | Status: DC | PRN

## 2017-01-24 MED ORDER — CYCLOBENZAPRINE HCL 10 MG PO TABS
10.0000 mg | ORAL_TABLET | Freq: Once | ORAL | Status: AC
  Administered 2017-01-24: 10 mg via ORAL
  Filled 2017-01-24: qty 1

## 2017-01-24 NOTE — MAU Note (Signed)
Pt presents to MAU with complaints of lower back pain that is getting worse. Pt states that she was evaluated in MAU on January the 23rd and was given macrobid and states that the medication is not helping

## 2017-01-24 NOTE — Discharge Instructions (Signed)
Round Ligament Pain Introduction The round ligament is a cord of muscle and tissue that helps to support the uterus. It can become a source of pain during pregnancy if it becomes stretched or twisted as the baby grows. The pain usually begins in the second trimester of pregnancy, and it can come and go until the baby is delivered. It is not a serious problem, and it does not cause harm to the baby. Round ligament pain is usually a short, sharp, and pinching pain, but it can also be a dull, lingering, and aching pain. The pain is felt in the lower side of the abdomen or in the groin. It usually starts deep in the groin and moves up to the outside of the hip area. Pain can occur with:  A sudden change in position.  Rolling over in bed.  Coughing or sneezing.  Physical activity. Follow these instructions at home: Watch your condition for any changes. Take these steps to help with your pain:  When the pain starts, relax. Then try:  Sitting down.  Flexing your knees up to your abdomen.  Lying on your side with one pillow under your abdomen and another pillow between your legs.  Sitting in a warm bath for 15-20 minutes or until the pain goes away.  Take over-the-counter and prescription medicines only as told by your health care provider.  Move slowly when you sit and stand.  Avoid long walks if they cause pain.  Stop or lessen your physical activities if they cause pain. Contact a health care provider if:  Your pain does not go away with treatment.  You feel pain in your back that you did not have before.  Your medicine is not helping. Get help right away if:  You develop a fever or chills.  You develop uterine contractions.  You develop vaginal bleeding.  You develop nausea or vomiting.  You develop diarrhea.  You have pain when you urinate. This information is not intended to replace advice given to you by your health care provider. Make sure you discuss any questions  you have with your health care provider. Document Released: 09/24/2008 Document Revised: 05/23/2016 Document Reviewed: 02/22/2015  2017 Elsevier  

## 2017-01-24 NOTE — MAU Provider Note (Signed)
History   Patient Barbara Mcfarland is a 25 year G1P0 at 19 weeks and 3 days here with complaints of lower back ache since January 21, 2017. She was seen in the MAU for this pain on 01/21/2017 and was treated for a UTI prophylactically with macrobid, pending urine cultures. Patient is back now because she says that the pain has not improved since starting the antibiotics.   Upon reviewing her chart, urine cultures were negative for growth.  Patient denies bleeding, leaking of fluid.  CSN: [id number]  Arrival date and time: 01/24/17 1000   None     Chief Complaint  Patient presents with  . Back Pain   Back Pain  This is a recurrent problem. The current episode started in the past 7 days. The problem occurs constantly. The problem is unchanged. The pain is present in the lumbar spine and sacro-iliac. The pain does not radiate. The pain is at a severity of 10/10. The pain is severe. The pain is the same all the time. Pertinent negatives include no abdominal pain, bladder incontinence, bowel incontinence, chest pain, dysuria, fever, headaches, leg pain, numbness, paresis, paresthesias, pelvic pain, perianal numbness, tingling, weakness or weight loss. She has tried analgesics for the symptoms. The treatment provided no relief.    OB History    Gravida Para Term Preterm AB Living   1         0   SAB TAB Ectopic Multiple Live Births                  Past Medical History:  Diagnosis Date  . Migraine headache from childhood    Past Surgical History:  Procedure Laterality Date  . NO PAST SURGERIES      Family History  Problem Relation Age of Onset  . Asthma Neg Hx   . Cancer Neg Hx   . Diabetes Neg Hx   . Hypertension Neg Hx     Social History  Substance Use Topics  . Smoking status: Never Smoker  . Smokeless tobacco: Never Used  . Alcohol use No    Allergies: No Known Allergies  No prescriptions prior to admission.    Review of Systems  Constitutional: Negative for fever  and weight loss.  HENT: Negative.   Eyes: Negative.   Respiratory: Negative.   Cardiovascular: Negative.  Negative for chest pain.  Gastrointestinal: Negative.  Negative for abdominal pain and bowel incontinence.  Endocrine: Negative.   Genitourinary: Negative for bladder incontinence, dysuria and pelvic pain.  Musculoskeletal: Positive for back pain.  Skin: Negative.   Allergic/Immunologic: Negative.   Neurological: Negative for tingling, weakness, numbness, headaches and paresthesias.  Psychiatric/Behavioral: Negative.    Physical Exam   Blood pressure 113/59, pulse 65, temperature 98.4 F (36.9 C), resp. rate 16, weight 155 lb (70.3 kg), last menstrual period 09/10/2016.  Physical Exam  Constitutional: She is oriented to person, place, and time. She appears well-developed.  HENT:  Head: Normocephalic.  Eyes: Pupils are equal, round, and reactive to light.  Neck: Normal range of motion.  Respiratory: Effort normal. No respiratory distress. She has no wheezes. She has no rales. She exhibits no tenderness.  GI: Soft. She exhibits no distension and no mass. There is no tenderness. There is no rebound and no guarding.  Abdomen is soft, non-tender, non-distended.   Genitourinary:  Genitourinary Comments: deferred  Musculoskeletal: Normal range of motion.  No CVA tenderness.   Neurological: She is alert and oriented to person, place, and time.  Skin: Skin is warm and dry.  FHR is 135, patient feels positive fetal movements.   MAU Course  Procedures  MDM -patient pain decreased after flexirel, patient desires to leave.   Assessment and Plan   1. Bilateral low back pain without sciatica, unspecified chronicity    2. Discharge home -RX for flexeril given -Discussed importance of belly band and normalcy of low back pain in pregnancy.  -Instructed patient to stop taking macrobid -Instructed patient [facility] and return to [facility] if she  develops worsening pain, dysuria, frequency, bleeding, leaking of fluid. Patient verbalized understanding.     Charlesetta Garibaldi Sheletha Bow CNM 01/24/2017, 1:49 PM

## 2017-04-01 ENCOUNTER — Encounter (HOSPITAL_COMMUNITY): Payer: Self-pay

## 2017-04-01 ENCOUNTER — Observation Stay (HOSPITAL_COMMUNITY)
Admission: AD | Admit: 2017-04-01 | Discharge: 2017-04-02 | Disposition: A | Discharge status: Home / Self Care | Payer: Self-pay | Source: Ambulatory Visit | Attending: Obstetrics and Gynecology | Admitting: Obstetrics and Gynecology

## 2017-04-01 DIAGNOSIS — O4703 False labor before 37 completed weeks of gestation, third trimester: Secondary | ICD-10-CM | POA: Diagnosis present

## 2017-04-01 DIAGNOSIS — Z3A29 29 weeks gestation of pregnancy: Secondary | ICD-10-CM | POA: Insufficient documentation

## 2017-04-01 DIAGNOSIS — O4700 False labor before 37 completed weeks of gestation, unspecified trimester: Principal | ICD-10-CM | POA: Insufficient documentation

## 2017-04-01 LAB — WET PREP, GENITAL
Clue Cells Wet Prep HPF POC: NONE SEEN
SPERM: NONE SEEN
TRICH WET PREP: NONE SEEN
Yeast Wet Prep HPF POC: NONE SEEN

## 2017-04-01 LAB — URINALYSIS, ROUTINE W REFLEX MICROSCOPIC
Bilirubin Urine: NEGATIVE
Glucose, UA: NEGATIVE mg/dL
Ketones, ur: NEGATIVE mg/dL
NITRITE: NEGATIVE
PROTEIN: 100 mg/dL — AB
SPECIFIC GRAVITY, URINE: 1.01 (ref 1.005–1.030)
pH: 6.5 (ref 5.0–8.0)

## 2017-04-01 LAB — TYPE AND SCREEN
ABO/RH(D): O POS
ANTIBODY SCREEN: NEGATIVE

## 2017-04-01 LAB — CBC
HEMATOCRIT: 38.7 % (ref 36.0–46.0)
HEMOGLOBIN: 13.9 g/dL (ref 12.0–15.0)
MCH: 32.9 pg (ref 26.0–34.0)
MCHC: 35.9 g/dL (ref 30.0–36.0)
MCV: 91.5 fL (ref 78.0–100.0)
Platelets: 194 10*3/uL (ref 150–400)
RBC: 4.23 MIL/uL (ref 3.87–5.11)
RDW: 13.3 % (ref 11.5–15.5)
WBC: 13.1 10*3/uL — AB (ref 4.0–10.5)

## 2017-04-01 LAB — URINALYSIS, MICROSCOPIC (REFLEX)

## 2017-04-01 MED ORDER — OXYCODONE-ACETAMINOPHEN 5-325 MG PO TABS: 1.0000 | ORAL_TABLET | ORAL | Status: DC | PRN

## 2017-04-01 MED ORDER — BETAMETHASONE SOD PHOS & ACET 6 (3-3) MG/ML IJ SUSP
12.0000 mg | INTRAMUSCULAR | Status: AC
  Administered 2017-04-01 – 2017-04-02 (×2): 12 mg via INTRAMUSCULAR
  Filled 2017-04-01 (×2): qty 2

## 2017-04-01 MED ORDER — PENICILLIN G POT IN DEXTROSE 60000 UNIT/ML IV SOLN
3.0000 10*6.[IU] | INTRAVENOUS | Status: DC
  Administered 2017-04-02 (×2): 3 10*6.[IU] via INTRAVENOUS
  Filled 2017-04-01 (×4): qty 50

## 2017-04-01 MED ORDER — LACTATED RINGERS IV BOLUS (SEPSIS)
1000.0000 mL | Freq: Once | INTRAVENOUS | Status: AC
  Administered 2017-04-01: 1000 mL via INTRAVENOUS

## 2017-04-01 MED ORDER — LACTATED RINGERS IV SOLN
INTRAVENOUS | Status: DC
  Administered 2017-04-01 – 2017-04-02 (×2): via INTRAVENOUS

## 2017-04-01 MED ORDER — MAGNESIUM SULFATE 40 G IN LACTATED RINGERS - SIMPLE
2.0000 g/h | INTRAVENOUS | Status: DC
  Filled 2017-04-01: qty 500

## 2017-04-01 MED ORDER — MAGNESIUM SULFATE BOLUS VIA INFUSION
4.0000 g | Freq: Once | INTRAVENOUS | Status: AC
  Administered 2017-04-01: 4 g via INTRAVENOUS
  Filled 2017-04-01: qty 500

## 2017-04-01 MED ORDER — ONDANSETRON HCL 4 MG/2ML IJ SOLN: 4.0000 mg | Freq: Four times a day (QID) | INTRAMUSCULAR | Status: DC | PRN

## 2017-04-01 MED ORDER — ACETAMINOPHEN 325 MG PO TABS
650.0000 mg | ORAL_TABLET | ORAL | Status: DC | PRN
  Administered 2017-04-02: 650 mg via ORAL
  Filled 2017-04-01: qty 2

## 2017-04-01 MED ORDER — LACTATED RINGERS IV SOLN: 500.0000 mL | INTRAVENOUS | Status: DC | PRN

## 2017-04-01 MED ORDER — OXYCODONE-ACETAMINOPHEN 5-325 MG PO TABS: 2.0000 | ORAL_TABLET | ORAL | Status: DC | PRN

## 2017-04-01 MED ORDER — SOD CITRATE-CITRIC ACID 500-334 MG/5ML PO SOLN: 30.0000 mL | ORAL | Status: DC | PRN

## 2017-04-01 MED ORDER — LIDOCAINE HCL (PF) 1 % IJ SOLN: 30.0000 mL | INTRAMUSCULAR | Status: DC | PRN

## 2017-04-01 MED ORDER — OXYTOCIN BOLUS FROM INFUSION: 500.0000 mL | Freq: Once | INTRAVENOUS | Status: DC

## 2017-04-01 MED ORDER — PENICILLIN G POTASSIUM 5000000 UNITS IJ SOLR
5.0000 10*6.[IU] | Freq: Once | INTRAVENOUS | Status: AC
  Administered 2017-04-01: 5 10*6.[IU] via INTRAVENOUS
  Filled 2017-04-01: qty 5

## 2017-04-01 MED ORDER — OXYTOCIN 40 UNITS IN LACTATED RINGERS INFUSION - SIMPLE MED: 2.5000 [IU]/h | INTRAVENOUS | Status: DC

## 2017-04-01 NOTE — H&P (Signed)
FACULTY PRACTICE ANTEPARTUM ADMISSION HISTORY AND PHYSICAL NOTE   History of Present Illness: Barbara Mcfarland is a 26 y.o. G1P0 at [redacted]w[redacted]d admitted for intermittent low abdominal pain since yesterday afternoon. Onset was spontaneous. She says the pan is getting stronger and more frequent. She noted blood when she wiped. She denies profuse vaginal bleeding, gush or leak of fluid. She reports dysuria. Denies fever, chills, nausea or vomiting.  Patient reports the fetal movement as active. Patient reports uterine contraction  activity every 6 minutes. Patient reports  vaginal bleeding as noted blood when she wiped after bathroom. Patient describes fluid per vagina as None. Fetal presentation is cephalic by bedside US  Patient Active Problem List   Diagnosis Date Noted  . Raynaud phenomenon 02/20/2016  . Migraine headache 11/15/2013    Past Medical History:  Diagnosis Date  . Migraine headache from childhood    Past Surgical History:  Procedure Laterality Date  . NO PAST SURGERIES      OB History  Gravida Para Term Preterm AB Living  1         0  SAB TAB Ectopic Multiple Live Births               # Outcome Date GA Lbr Len/2nd Weight Sex Delivery Anes PTL Lv  1 Current               Social History   Social History  . Marital status: Single    Spouse name: N/A  . Number of children: N/A  . Years of education: N/A   Social History Main Topics  . Smoking status: Never Smoker  . Smokeless tobacco: Never Used  . Alcohol use No  . Drug use: No  . Sexual activity: Yes   Other Topics Concern  . None   Social History Narrative  . None    Family History  Problem Relation Age of Onset  . Asthma Neg Hx   . Cancer Neg Hx   . Diabetes Neg Hx   . Hypertension Neg Hx     No Known Allergies  Prescriptions Prior to Admission  Medication Sig Dispense Refill Last Dose  . acetaminophen (TYLENOL) 160 MG/5ML liquid Take 325 mg by mouth every 4 (four) hours as needed for pain.    Past 2 Month at Unknown time  . cyclobenzaprine (FLEXERIL) 10 MG tablet Take 1 tablet (10 mg total) by mouth every 8 (eight) hours as needed for muscle spasms. 30 tablet 1 Past 2 Month at Unknown time  . nitrofurantoin, macrocrystal-monohydrate, (MACROBID) 100 MG capsule Take 100 mg by mouth 2 (two) times daily.   04/01/2017 at Unknown time  . Prenatal Vit-Fe Fumarate-FA (PRENATAL MULTIVITAMIN) TABS tablet Take 1 tablet by mouth daily at 12 noon.   03/31/2017 at Unknown time  . PRESCRIPTION MEDICATION Place 1 Applicatorful vaginally at bedtime. Vaginal cream   03/31/2017 at Unknown time    Review of Systems - Negative except intermittent abdominal pain and blood when she wipes  Vitals:  BP 118/66 (BP Location: Left Arm)   Pulse 77   Temp 98.5 F (36.9 C) (Oral)   Resp 17   LMP 09/10/2016  Physical Examination: CONSTITUTIONAL: Well-developed, well-nourished female in no acute distress.  HENT:  Normocephalic, atraumatic, External right and left ear normal. Oropharynx is clear and moist EYES: Conjunctivae and EOM are normal. Pupils are equal, round, and reactive to light. No scleral icterus.  NECK: Normal range of motion, supple, no masses SKIN: Skin is warm  and dry. No rash noted. Not diaphoretic. No erythema. No pallor. NEUROLGIC: Alert and oriented to person, place, and time. Normal reflexes, muscle tone coordination. No cranial nerve deficit noted. PSYCHIATRIC: Normal mood and affect. Normal behavior. Normal judgment and thought content. CARDIOVASCULAR: Normal heart rate noted, regular rhythm RESPIRATORY: Effort and breath sounds normal, no problems with respiration noted ABDOMEN: Soft, nontender, nondistended, gravid. MUSCULOSKELETAL: Normal range of motion. No edema and no tenderness. 2+ distal pulses.  Cervix: Evaluated by sterile speculum exam. and found to be 1 cm/ 50%and fetal presentation is unsure. Membranes:intact Fetal Monitoring:Baseline: 150 bpm, Variability: Good {> 6 bpm) and  Accelerations: Reactive Tocometer: some contractions every 2-3 minutes  Labs:  Results for orders placed or performed during the hospital encounter of 04/01/17 (from the past 24 hour(s))  Urinalysis, Routine w reflex microscopic   Collection Time: 04/01/17  7:20 PM  Result Value Ref Range   Color, Urine ORANGE (A) YELLOW   APPearance CLEAR CLEAR   Specific Gravity, Urine 1.010 1.005 - 1.030   pH 6.5 5.0 - 8.0   Glucose, UA NEGATIVE NEGATIVE mg/dL   Hgb urine dipstick LARGE (A) NEGATIVE   Bilirubin Urine NEGATIVE NEGATIVE   Ketones, ur NEGATIVE NEGATIVE mg/dL   Protein, ur 409 (A) NEGATIVE mg/dL   Nitrite NEGATIVE NEGATIVE   Leukocytes, UA TRACE (A) NEGATIVE  Urinalysis, Microscopic (reflex)   Collection Time: 04/01/17  7:20 PM  Result Value Ref Range   RBC / HPF TOO NUMEROUS TO COUNT 0 - 5 RBC/hpf   WBC, UA TOO NUMEROUS TO COUNT 0 - 5 WBC/hpf   Bacteria, UA RARE (A) NONE SEEN   Squamous Epithelial / LPF 0-5 (A) NONE SEEN   Urine-Other MUCOUS PRESENT   CBC   Collection Time: 04/01/17  8:05 PM  Result Value Ref Range   WBC 13.1 (H) 4.0 - 10.5 K/uL   RBC 4.23 3.87 - 5.11 MIL/uL   Hemoglobin 13.9 12.0 - 15.0 g/dL   HCT 81.1 91.4 - 78.2 %   MCV 91.5 78.0 - 100.0 fL   MCH 32.9 26.0 - 34.0 pg   MCHC 35.9 30.0 - 36.0 g/dL   RDW 95.6 21.3 - 08.6 %   Platelets 194 150 - 400 K/uL    Imaging Studies: No results found.   Assessment and Plan: Barbara Mcfarland is a 26 yo female G1P1000 at [redacted]w[redacted]d here with abdominal pain suggestive for preterm contraction. UA is concerning for UTI. Doubt pyelo without constitutional symptoms. Wet prep negative. She was quite uncomfortable rating her pain 10/10 and the decision was made to admit. Fetal wellbeing is reassuring. She has been contracting every 2-3 minutes that has spaced out. Her pain has improved as well. Unable to collect fetal fibronectin, recent cervical exam.    Status LR bolus and BMZ in MAU  Continue LR infusion at 125 ml/hr  Second  BMZ on 4/4  Magnesium for tocolysis and neuroprotection  PCN for GBS unknown. Culture obtained  Follow up Urine culture, GBS and GC/CT   Candelaria Stagers, MD.  PGY-2, [facility] Pager [phone] 04/01/17  10:44 PM  OB FELLOW HISTORY AND PHYSICAL ATTESTATION  I have seen and examined this patient; I agree with above documentation in the resident's note.    Jen Mow, DO OB Fellow

## 2017-04-01 NOTE — MAU Provider Note (Signed)
History     CSN: [id number]  Arrival date and time: 04/01/17 1844   First Provider Initiated Contact with Patient 04/01/17 1959      No chief complaint on file.  HPI   Barbara Mcfarland is a 26 y.o. female G1P0 @ [redacted]w[redacted]d here in MAU with abdominal pain. The pain started yesterday however worsened today. She was seen In the office yesterday and was told she had blood in her urine, she was started on antibiotics. She returned to the HD due to her pain worsening. Her Cervix was checked and was told it was "good".  She currently rates her pain 10/10. The pain is located in her lower abdomen. She denies vaginal bleeding. She attests to dysuria.   OB History    Gravida Para Term Preterm AB Living   1         0   SAB TAB Ectopic Multiple Live Births                  Past Medical History:  Diagnosis Date  . Migraine headache from childhood    Past Surgical History:  Procedure Laterality Date  . NO PAST SURGERIES      Family History  Problem Relation Age of Onset  . Asthma Neg Hx   . Cancer Neg Hx   . Diabetes Neg Hx   . Hypertension Neg Hx     Social History  Substance Use Topics  . Smoking status: Never Smoker  . Smokeless tobacco: Never Used  . Alcohol use No    Allergies: No Known Allergies  Prescriptions Prior to Admission  Medication Sig Dispense Refill Last Dose  . acetaminophen (TYLENOL) 160 MG/5ML liquid Take 325 mg by mouth every 4 (four) hours as needed for pain.   01/23/2017 at Unknown time  . cyclobenzaprine (FLEXERIL) 10 MG tablet Take 1 tablet (10 mg total) by mouth every 8 (eight) hours as needed for muscle spasms. 30 tablet 1   . Prenatal Vit-Fe Fumarate-FA (PRENATAL MULTIVITAMIN) TABS tablet Take 1 tablet by mouth daily at 12 noon.   01/23/2017 at Unknown time   Results for orders placed or performed during the hospital encounter of 04/01/17 (from the past 48 hour(s))  Urinalysis, Routine w reflex microscopic     Status: Abnormal   Collection Time: 04/01/17   7:20 PM  Result Value Ref Range   Color, Urine ORANGE (A) YELLOW    Comment: BIOCHEMICALS MAY BE AFFECTED BY COLOR   APPearance CLEAR CLEAR   Specific Gravity, Urine 1.010 1.005 - 1.030   pH 6.5 5.0 - 8.0   Glucose, UA NEGATIVE NEGATIVE mg/dL   Hgb urine dipstick LARGE (A) NEGATIVE   Bilirubin Urine NEGATIVE NEGATIVE   Ketones, ur NEGATIVE NEGATIVE mg/dL   Protein, ur 782 (A) NEGATIVE mg/dL   Nitrite NEGATIVE NEGATIVE   Leukocytes, UA TRACE (A) NEGATIVE  Urinalysis, Microscopic (reflex)     Status: Abnormal   Collection Time: 04/01/17  7:20 PM  Result Value Ref Range   RBC / HPF TOO NUMEROUS TO COUNT 0 - 5 RBC/hpf   WBC, UA TOO NUMEROUS TO COUNT 0 - 5 WBC/hpf   Bacteria, UA RARE (A) NONE SEEN   Squamous Epithelial / LPF 0-5 (A) NONE SEEN   Urine-Other MUCOUS PRESENT   CBC     Status: Abnormal   Collection Time: 04/01/17  8:05 PM  Result Value Ref Range   WBC 13.1 (H) 4.0 - 10.5 K/uL   RBC 4.23 3.87 -  5.11 MIL/uL   Hemoglobin 13.9 12.0 - 15.0 g/dL   HCT 40.9 81.1 - 91.4 %   MCV 91.5 78.0 - 100.0 fL   MCH 32.9 26.0 - 34.0 pg   MCHC 35.9 30.0 - 36.0 g/dL   RDW 78.2 95.6 - 21.3 %   Platelets 194 150 - 400 K/uL   Review of Systems  Gastrointestinal: Negative for nausea and vomiting.  Musculoskeletal: Positive for back pain.   Physical Exam   Blood pressure 118/66, pulse 77, temperature 98.5 F (36.9 C), temperature source Oral, resp. rate 17, last menstrual period 09/10/2016.  Physical Exam  Constitutional: She is oriented to person, place, and time. She appears well-developed and well-nourished. No distress.  HENT:  Head: Normocephalic.  Eyes: Pupils are equal, round, and reactive to light.  Respiratory: Effort normal.  Genitourinary:  Genitourinary Comments: Vagina - Small amount of white vaginal discharge, no odor  Cervix - No contact bleeding, no active bleeding Dilation: Fingertip Effacement (%): 50 Exam by:: Venia Carbon, NP Wet prep and GC collected    Musculoskeletal: Normal range of motion.  Neurological: She is alert and oriented to person, place, and time.  Skin: Skin is warm. She is not diaphoretic.  Psychiatric: Her behavior is normal.   Fetal Tracing: Baseline: 130 bpm Variability: Moderate  Accelerations: 15x15 Decelerations: None Toco: Q2-3  MAU Course  Procedures  None.   MDM  Patient rating her pain 10/10, patient uncomfortable in bed.  Discussed patient with Dr. Adrian Blackwater , contractions Q2-3 LR bolus  Betamethasone Magnesium  Urine culture Wet prep & GC   Unable to collect fetal fibronectin, recent cervical exam.   Assessment and Plan   A:  Threatened preterm labor Hematuria    P:  Admit to labor and delivery  Duane Lope, NP 04/01/2017 8:41 PM

## 2017-04-02 LAB — GC/CHLAMYDIA PROBE AMP (~~LOC~~) NOT AT ARMC
CHLAMYDIA, DNA PROBE: NEGATIVE
NEISSERIA GONORRHEA: NEGATIVE

## 2017-04-02 LAB — ABO/RH: ABO/RH(D): O POS

## 2017-04-02 NOTE — Progress Notes (Signed)
Patient ID: Barbara Mcfarland, female   DOB: 1991/08/29, 26 y.o.   MRN: [id number]  S: Patient seen & examined for progress of preterm labor. Patient comfortable and slept through the night without feeling contractions.    O:  Vitals:   04/02/17 0403 04/02/17 0505 04/02/17 0600 04/02/17 0620  BP: 109/68 110/68  116/71  Pulse: 92 78  80  Resp: Temp:      TempSrc:      Weight:      Height:        Dilation: Fingertip Effacement (%): 50 Exam by:: Dr Omer Jack Unchanged from admission.  FHT: 125bpm, mod var, +accels, no decels TOCO: q21min not really feeling   A/P: D/C Mag and PCN Transition off tocolysis Possibly d/c home today

## 2017-04-02 NOTE — Progress Notes (Signed)
Discharge instructions gone over with patient; 2nd BMZ given; no questions or concerns verbalized by patient at this time; patient walked to private vehicle

## 2017-04-02 NOTE — Discharge Summary (Signed)
OB Discharge Summary     Patient Name: Barbara Mcfarland DOB: May 25, 1991 MRN: [id number]  Date of admission: 04/01/2017 Delivering MD: This patient has no babies on file.  Date of discharge: 04/02/2017  Admitting diagnosis: 29w pain in vag when pee, set here by hd Intrauterine pregnancy: [redacted]w[redacted]d     Secondary diagnosis:  Active Problems:   Threatened preterm labor, third trimester   Preterm labor in third trimester  Additional problems: none     Discharge diagnosis: threatened preterm labor                                                                                                Hospital course:  Patient was admitted for possible preterm labor. She received 12 hours of magnesium and PCN. She had no contractions and her cervix remained finger tip. Mag and PCN were d/c'd and patient observed for 12 hours. No contractions and she remained comfortable. Patient received second betamethasone.   Physical exam  Vitals:   04/02/17 1219 04/02/17 1416 04/02/17 1417 04/02/17 1805  BP: 116/70 (!) 112/55 (!) 105/59 108/71  Pulse: 76 81 73 75  Resp: Temp:      TempSrc:      Weight:      Height:       General: alert, cooperative and no distress  Abd: Soft NT/ND Ext: minimal swelling FHTs: 140's mod var/reactive toco: no contractions.  Labs: Lab Results  Component Value Date   WBC 13.1 (H) 04/01/2017   HGB 13.9 04/01/2017   HCT 38.7 04/01/2017   MCV 91.5 04/01/2017   PLT 194 04/01/2017   CMP Latest Ref Rng & Units 11/15/2013  Glucose 70 - 99 mg/dL 409(W)  BUN 6 - 23 mg/dL 13  Creatinine 1.19 - 1.47 mg/dL 8.29  Sodium 562 - 130 mEq/L 136  Potassium 3.5 - 5.3 mEq/L 4.1  Chloride 96 - 112 mEq/L 102  CO2 19 - 32 mEq/L 26  Calcium 8.4 - 10.5 mg/dL 9.7  Total Protein 6.0 - 8.3 g/dL 7.9  Total Bilirubin 0.3 - 1.2 mg/dL 0.5  Alkaline Phos 39 - 117 U/L 82  AST 0 - 37 U/L 13  ALT 0 - 35 U/L 12    Discharge instruction: per After Visit Summary and "Baby and Me  Booklet".  After visit meds:  Allergies as of 04/02/2017   No Known Allergies     Medication List    STOP taking these medications   prenatal multivitamin Tabs tablet     TAKE these medications   acetaminophen 160 MG/5ML liquid Commonly known as:  TYLENOL Take 325 mg by mouth every 4 (four) hours as needed for pain.   clotrimazole 1 % vaginal cream Commonly known as:  GYNE-LOTRIMIN Place 1 Applicatorful vaginally at bedtime.   cyclobenzaprine 10 MG tablet Commonly known as:  FLEXERIL Take 1 tablet (10 mg total) by mouth every 8 (eight) hours as needed for muscle spasms.   nitrofurantoin (macrocrystal-monohydrate) 100 MG capsule Commonly known as:  MACROBID Take 100 mg by mouth 2 (two) times daily.  Diet: routine diet  Activity: Advance as tolerated. Pelvic rest for 6 weeks.   Outpatient follow up: 1 wk Follow up Appt:No future appointments. Follow up Visit:No Follow-up on file.  04/02/2017 Ernestina Penna, MD

## 2017-04-02 NOTE — Discharge Instructions (Signed)
Informacin sobre Government social research officer  (Preterm Labor Information)  Se llama parto prematuro cuando se inicia antes de las 37 semanas de [place]. La duracin de un embarazo normal es de 39 a 41 semanas.  CAUSAS  Generalmente las causas del parto prematuro no se conocen. La causa ms frecuente conocida es una infeccin.  FACTORES DE RIESGO   Historia previa de parto prematuro.  Romper la bolsa de aguas antes de [place].  La placenta cubre la abertura del cuello.  La placenta se despega del tero.  El cuello es demasiado dbil para contener al beb en el tero.  Hay mucho lquido en el saco amnitico.  Consumo de drogas o hbito de fumar durante el [place].  No aumentar de peso lo suficiente durante el [employer].  Mujeres menores de 18 aos o mayores de [address] [place].  Tener bajos ingresos.  Pertenecer a Engineer, production. SNTOMAS   Clicos similares a los menstruales, dolor en el vientre (abdominal) o dolor en la espalda.  Contracciones regulares, tan frecuentes como seis en [employer]. Pueden ser suaves o dolorosas.  Contracciones que comienzan en la parte superior del vientre. Luego bajan hacia la zona inferior del vientre y [employer].  Presin en la zona inferior del vientre que Advertising account executive.  Sangrado que proviene de la vagina.  Prdida de lquido por la vagina. TRATAMIENTO  El tratamiento depende de:   Su estado.  El [place] del beb.  Cuntas semanas tiene de [place]. El mdico podr indicarle:   Medicamentos para Print production planner.  Que permanezca en la cama excepto para ir al bao (reposo en cama).  Que permanezca en el hospital. QU DEBE HACER SI PIENSA QUE EST EN TRABAJO DE PARTO PREMATURO?  Comunquese con su mdico de inmediato. Debe concurrir al hospital para ser controlada inmediatamente.  CMO PUEDE EVITAR EL TRABAJO DE PARTO PREMATURO EN FUTUROS EMBARAZOS?   Si fuma, abandone el hbito.  Mantenga un aumento de peso  saludable.  Notome drogas ni manipule sustancias qumicas que no necesita.  Informe a su mdico si piensa que tiene una infeccin.  Informe a su mdico si tuvo un trabajo de parto prematuro anteriormente. Esta informacin no tiene Theme park manager el consejo del mdico. Asegrese de hacerle al mdico cualquier pregunta que tenga. Document Released: 01/18/2011 Document Revised: 08/18/2013 Elsevier Interactive Patient Education  2017 [employer]. Preterm Labor and Birth Information Pregnancy normally lasts 39-41 weeks. Preterm labor is when labor starts early. It starts before you have been pregnant for 37 whole weeks. What are the risk factors for preterm labor? Preterm labor is more likely to occur in women who:  Have an infection while pregnant.  Have a cervix that is short.  Have gone into preterm labor before.  Have had surgery on their cervix.  Are younger than age 66.  Are older than age 59.  Are African American.  Are pregnant with two or more babies.  Take street drugs while pregnant.  Smoke while pregnant.  Do not gain enough weight while pregnant.  Got pregnant right after another pregnancy. What are the symptoms of preterm labor? Symptoms of preterm labor include:  Cramps. The cramps may feel like the cramps some women get during their period. The cramps may happen with watery poop (diarrhea).  Pain in the belly (abdomen).  Pain in the lower back.  Regular contractions or tightening. It may feel like your belly is getting tighter.  Pressure in the lower belly that seems to  get stronger.  More fluid (discharge) leaking from the vagina. The fluid may be watery or bloody.  Water breaking. Why is it important to notice signs of preterm labor? Babies who are born early may not be fully developed. They have a higher chance for:  Long-term heart problems.  Long-term lung problems.  Trouble controlling body systems, like breathing.  Bleeding in  the brain.  A condition called cerebral palsy.  Learning difficulties.  Death. These risks are highest for babies who are born before 34 weeks of pregnancy. How is preterm labor treated? Treatment depends on:  How long you were pregnant.  Your condition.  The health of your baby. Treatment may involve:  Having a stitch (suture) placed in your cervix. When you give birth, your cervix opens so the baby can come out. The stitch keeps the cervix from opening too soon.  Staying at the hospital.  Taking or getting medicines, such as:  Hormone medicines.  Medicines to stop contractions.  Medicines to help the babys lungs develop.  Medicines to prevent your baby from having cerebral palsy. What should I do if I am in preterm labor? If you think you are going into labor too soon, call your doctor right away. How can I prevent preterm labor?  Do not use any tobacco products.  Examples of these are cigarettes, chewing tobacco, and e-cigarettes.  If you need help quitting, ask your doctor.  Do not use street drugs.  Do not use any medicines unless you ask your doctor if they are safe for you.  Talk with your doctor before taking any herbal supplements.  Make sure you gain enough weight.  Watch for infection. If you think you might have an infection, get it checked right away.  If you have gone into preterm labor before, tell your doctor. This information is not intended to replace advice given to you by your health care provider. Make sure you discuss any questions you have with your health care provider. Document Released: 03/14/2009 Document Revised: 05/28/2016 Document Reviewed: 05/08/2016 Elsevier Interactive Patient Education  2017 [employer].

## 2017-04-03 LAB — CULTURE, OB URINE
Culture: <10000 — AB
Special Requests: NORMAL

## 2017-05-15 LAB — OB RESULTS CONSOLE GBS: STREP GROUP B AG: NEGATIVE

## 2017-06-08 ENCOUNTER — Encounter (HOSPITAL_COMMUNITY): Payer: Self-pay | Admitting: *Deleted

## 2017-06-08 ENCOUNTER — Inpatient Hospital Stay (HOSPITAL_COMMUNITY)
Admission: AD | Admit: 2017-06-08 | Discharge: 2017-06-09 | Disposition: A | Discharge status: Home / Self Care | Payer: 59 | Source: Ambulatory Visit | Attending: Obstetrics and Gynecology | Admitting: Obstetrics and Gynecology

## 2017-06-08 DIAGNOSIS — Z3A38 38 weeks gestation of pregnancy: Secondary | ICD-10-CM | POA: Insufficient documentation

## 2017-06-08 DIAGNOSIS — O99613 Diseases of the digestive system complicating pregnancy, third trimester: Secondary | ICD-10-CM | POA: Insufficient documentation

## 2017-06-08 DIAGNOSIS — A084 Viral intestinal infection, unspecified: Secondary | ICD-10-CM | POA: Insufficient documentation

## 2017-06-08 DIAGNOSIS — O9989 Other specified diseases and conditions complicating pregnancy, childbirth and the puerperium: Secondary | ICD-10-CM | POA: Diagnosis not present

## 2017-06-08 DIAGNOSIS — R1110 Vomiting, unspecified: Secondary | ICD-10-CM | POA: Diagnosis present

## 2017-06-08 NOTE — MAU Note (Signed)
Patient presents to MAU with c/o of emesis. Since this morning after church at 1100. Has not been able to keep anything down since this morning. Vomited 4x today. Parents had stomach bug the last 2 days. +FM. Denies diarrhea or fever. Denies contractions, VB, or LOF.

## 2017-06-09 DIAGNOSIS — A084 Viral intestinal infection, unspecified: Secondary | ICD-10-CM

## 2017-06-09 DIAGNOSIS — O9989 Other specified diseases and conditions complicating pregnancy, childbirth and the puerperium: Secondary | ICD-10-CM

## 2017-06-09 LAB — URINALYSIS, ROUTINE W REFLEX MICROSCOPIC
BILIRUBIN URINE: NEGATIVE
GLUCOSE, UA: NEGATIVE mg/dL
HGB URINE DIPSTICK: NEGATIVE
Ketones, ur: NEGATIVE mg/dL
NITRITE: NEGATIVE
Protein, ur: NEGATIVE mg/dL
SPECIFIC GRAVITY, URINE: 1.012 (ref 1.005–1.030)
pH: 7 (ref 5.0–8.0)

## 2017-06-09 MED ORDER — ONDANSETRON 8 MG PO TBDP: 8.0000 mg | ORAL_TABLET | Freq: Three times a day (TID) | ORAL | 0 refills | Status: DC | PRN

## 2017-06-09 NOTE — MAU Provider Note (Signed)
History     CSN: [id number]  Arrival date and time: 06/08/17 2328   First Provider Initiated Contact with Patient 06/09/17 0014      Chief Complaint  Patient presents with  . Emesis   Barbara Mcfarland is a 26 y.o. G1P0 at [redacted]w[redacted]d who presents today with N/V. She states that both her parent have had this same virus. She denies any fever, diarrhea, VB, LOF or contractions. She reports normal fetal movement.    Emesis   This is a new problem. The current episode started today. The problem occurs 2 to 4 times per day. The problem has been unchanged. The emesis has an appearance of stomach contents. There has been no fever. Associated symptoms include abdominal pain. Pertinent negatives include no chills, diarrhea or fever. Risk factors include ill contacts (both parents have been sick. ). She has tried nothing for the symptoms.     Past Medical History:  Diagnosis Date  . Migraine headache from childhood    Past Surgical History:  Procedure Laterality Date  . NO PAST SURGERIES      Family History  Problem Relation Age of Onset  . Asthma Neg Hx   . Cancer Neg Hx   . Diabetes Neg Hx   . Hypertension Neg Hx     Social History  Substance Use Topics  . Smoking status: Never Smoker  . Smokeless tobacco: Never Used  . Alcohol use No    Allergies: No Known Allergies  Prescriptions Prior to Admission  Medication Sig Dispense Refill Last Dose  . acetaminophen (TYLENOL) 160 MG/5ML liquid Take 325 mg by mouth every 4 (four) hours as needed for pain.   prn at Unknown time  . clotrimazole (GYNE-LOTRIMIN) 1 % vaginal cream Place 1 Applicatorful vaginally at bedtime.   03/31/2017 at Unknown time  . cyclobenzaprine (FLEXERIL) 10 MG tablet Take 1 tablet (10 mg total) by mouth every 8 (eight) hours as needed for muscle spasms. 30 tablet 1 Past 2 Month at Unknown time  . nitrofurantoin, macrocrystal-monohydrate, (MACROBID) 100 MG capsule Take 100 mg by mouth 2 (two) times daily.   04/01/2017 at  1200    Review of Systems  Constitutional: Negative for chills and fever.  Gastrointestinal: Positive for abdominal pain, nausea and vomiting. Negative for diarrhea.  Genitourinary: Negative for pelvic pain, vaginal bleeding and vaginal discharge.   Physical Exam   Blood pressure 112/72, pulse 91, temperature 98.6 F (37 C), temperature source Oral, resp. rate 16, height 5\' 6"  (1.676 m), weight 181 lb (82.1 kg), last menstrual period 09/10/2016, SpO2 99 %.  Physical Exam  Nursing note and vitals reviewed. Constitutional: She is oriented to person, place, and time. She appears well-developed and well-nourished. No distress.  HENT:  Head: Normocephalic.  Cardiovascular: Normal rate.   Respiratory: Effort normal.  GI: Soft. There is no tenderness. There is no rebound.  Neurological: She is alert and oriented to person, place, and time.  Skin: Skin is warm and dry.  Psychiatric: She has a normal mood and affect.   Results for orders placed or performed during the hospital encounter of 06/08/17 (from the past 24 hour(s))  Urinalysis, Routine w reflex microscopic     Status: Abnormal   Collection Time: 06/08/17 11:43 PM  Result Value Ref Range   Color, Urine YELLOW YELLOW   APPearance CLEAR CLEAR   Specific Gravity, Urine 1.012 1.005 - 1.030   pH 7.0 5.0 - 8.0   Glucose, UA NEGATIVE NEGATIVE mg/dL  Hgb urine dipstick NEGATIVE NEGATIVE   Bilirubin Urine NEGATIVE NEGATIVE   Ketones, ur NEGATIVE NEGATIVE mg/dL   Protein, ur NEGATIVE NEGATIVE mg/dL   Nitrite NEGATIVE NEGATIVE   Leukocytes, UA MODERATE (A) NEGATIVE   RBC / HPF 0-5 0 - 5 RBC/hpf   WBC, UA 0-5 0 - 5 WBC/hpf   Bacteria, UA FEW (A) NONE SEEN   Squamous Epithelial / LPF 0-5 (A) NONE SEEN   Mucous PRESENT    FHT: 120, moderate with 15x15 accels, no decels Toco: no UCs   MAU Course  Procedures  MDM   Assessment and Plan   1. Viral gastroenteritis   2. [redacted] weeks gestation of pregnancy    DC home Comfort  measures reviewed  3rd Trimester precautions  labor precautions  Fetal kick counts RX: zofran PRN #[id number]  Return to MAU as needed FU with OB as planned  Follow-up Information    [facility].   Specialty:  Obstetrics and Gynecology Contact information: [address] [place] [id number] [phone]           Thressa ShellerHeather Hogan 06/09/2017, 12:17 AM

## 2017-06-09 NOTE — Discharge Instructions (Signed)
Viral Gastroenteritis, Adult Viral gastroenteritis is also known as the stomach flu. This condition is caused by certain germs (viruses). These germs can be passed from person to person very easily (are very contagious). This condition can cause sudden watery poop (diarrhea), fever, and throwing up (vomiting). Having watery poop and throwing up can make you feel weak and cause you to get dehydrated. Dehydration can make you tired and thirsty, make you have a dry mouth, and make it so you pee (urinate) less often. Older adults and people with other diseases or a weak defense system (immune system) are at higher risk for dehydration. It is important to replace the fluids that you lose from having watery poop and throwing up. Follow these instructions at home: Follow instructions from your doctor about how to care for yourself at home. Eating and drinking  Follow these instructions as told by your doctor:  Take an oral rehydration solution (ORS). This is a drink that is sold at pharmacies and stores.  Drink clear fluids in small amounts as you are able, such as: ? Water. ? Ice chips. ? Diluted fruit juice. ? Low-calorie sports drinks.  Eat bland, easy-to-digest foods in small amounts as you are able, such as: ? Bananas. ? Applesauce. ? Rice. ? Low-fat (lean) meats. ? Toast. ? Crackers.  Avoid fluids that have a lot of sugar or caffeine in them.  Avoid alcohol.  Avoid spicy or fatty foods.  General instructions  Drink enough fluid to keep your pee (urine) clear or pale yellow.  Wash your hands often. If you cannot use soap and water, use hand sanitizer.  Make sure that all people in your home wash their hands well and often.  Rest at home while you get better.  Take over-the-counter and prescription medicines only as told by your doctor.  Watch your condition for any changes.  Take a warm bath to help with any burning or pain from having watery poop.  Keep all follow-up  visits as told by your doctor. This is important. Contact a doctor if:  You cannot keep fluids down.  Your symptoms get worse.  You have new symptoms.  You feel light-headed or dizzy.  You have muscle cramps. Get help right away if:  You have chest pain.  You feel very weak or you pass out (faint).  You see blood in your throw-up.  Your throw-up looks like coffee grounds.  You have bloody or black poop (stools) or poop that look like tar.  You have a very bad headache, a stiff neck, or both.  You have a rash.  You have very bad pain, cramping, or bloating in your belly (abdomen).  You have trouble breathing.  You are breathing very quickly.  Your heart is beating very quickly.  Your skin feels cold and clammy.  You feel confused.  You have pain when you pee.  You have signs of dehydration, such as: ? Dark pee, hardly any pee, or no pee. ? Cracked lips. ? Dry mouth. ? Sunken eyes. ? Sleepiness. ? Weakness. This information is not intended to replace advice given to you by your health care provider. Make sure you discuss any questions you have with your health care provider. Document Released: 06/03/2008 Document Revised: 07/05/2016 Document Reviewed: 08/22/2015 Elsevier Interactive Patient Education  2017 [employer] Food Choices to Help Relieve Diarrhea, Adult When you have diarrhea, the foods you eat and your eating habits are very important. Choosing the right foods and drinks can   help:  Relieve diarrhea.  Replace lost fluids and nutrients.  Prevent dehydration.  What general guidelines should I follow? Relieving diarrhea  Choose foods with less than 2 g or .07 oz. of fiber per serving.  Limit fats to less than 8 tsp (38 g or 1.34 oz.) a day.  Avoid the following: ? Foods and beverages sweetened with high-fructose corn syrup, honey, or sugar alcohols such as xylitol, sorbitol, and mannitol. ? Foods that contain a lot of fat or  sugar. ? Fried, greasy, or spicy foods. ? High-fiber grains, breads, and cereals. ? Raw fruits and vegetables.  Eat foods that are rich in probiotics. These foods include dairy products such as yogurt and fermented milk products. They help increase healthy bacteria in the stomach and intestines (gastrointestinal tract, or GI tract).  If you have lactose intolerance, avoid dairy products. These may make your diarrhea worse.  Take medicine to help stop diarrhea (antidiarrheal medicine) only as told by your health care provider. Replacing nutrients  Eat small meals or snacks every 3-4 hours.  Eat bland foods, such as white rice, toast, or baked potato, until your diarrhea starts to get better. Gradually reintroduce nutrient-rich foods as tolerated or as told by your health care provider. This includes: ? Well-cooked protein foods. ? Peeled, seeded, and soft-cooked fruits and vegetables. ? Low-fat dairy products.  Take vitamin and mineral supplements as told by your health care provider. Preventing dehydration   Start by sipping water or a special solution to prevent dehydration (oral rehydration solution, ORS). Urine that is clear or pale yellow means that you are getting enough fluid.  Try to drink at least 8-10 cups of fluid each day to help replace lost fluids.  You may add other liquids in addition to water, such as clear juice or decaffeinated sports drinks, as tolerated or as told by your health care provider.  Avoid drinks with caffeine, such as coffee, tea, or soft drinks.  Avoid alcohol. What foods are recommended? The items listed may not be a complete list. Talk with your health care provider about what dietary choices are best for you. Grains White rice. White, French, or pita breads (fresh or toasted), including plain rolls, buns, or bagels. White pasta. Saltine, soda, or graham crackers. Pretzels. Low-fiber cereal. Cooked cereals made with water (such as cornmeal,  farina, or cream cereals). Plain muffins. Matzo. Melba toast. Zwieback. Vegetables Potatoes (without the skin). Most well-cooked and canned vegetables without skins or seeds. Tender lettuce. Fruits Apple sauce. Fruits canned in juice. Cooked apricots, cherries, grapefruit, peaches, pears, or plums. Fresh bananas and cantaloupe. Meats and other protein foods Baked or boiled chicken. Eggs. Tofu. Fish. Seafood. Smooth nut butters. Ground or well-cooked tender beef, ham, veal, lamb, pork, or poultry. Dairy Plain yogurt, kefir, and unsweetened liquid yogurt. Lactose-free milk, buttermilk, skim milk, or soy milk. Low-fat or nonfat hard cheese. Beverages Water. Low-calorie sports drinks. Fruit juices without pulp. Strained tomato and vegetable juices. Decaffeinated teas. Sugar-free beverages not sweetened with sugar alcohols. Oral rehydration solutions, if approved by your health care provider. Seasoning and other foods Bouillon, broth, or soups made from recommended foods. What foods are not recommended? The items listed may not be a complete list. Talk with your health care provider about what dietary choices are best for you. Grains Whole grain, whole wheat, bran, or rye breads, rolls, pastas, and crackers. Wild or brown rice. Whole grain or bran cereals. Barley. Oats and oatmeal. Corn tortillas or taco shells. Granola. Popcorn.   Vegetables Raw vegetables. Fried vegetables. Cabbage, broccoli, Brussels sprouts, artichokes, baked beans, beet greens, corn, kale, legumes, peas, sweet potatoes, and yams. Potato skins. Cooked spinach and cabbage. Fruits Dried fruit, including raisins and dates. Raw fruits. Stewed or dried prunes. Canned fruits with syrup. Meat and other protein foods Fried or fatty meats. Deli meats. Chunky nut butters. Nuts and seeds. Beans and lentils. Bacon. Hot dogs. Sausage. Dairy High-fat cheeses. Whole milk, chocolate milk, and beverages made with milk, such as milk shakes.  Half-and-half. Cream. sour cream. Ice cream. Beverages Caffeinated beverages (such as coffee, tea, soda, or energy drinks). Alcoholic beverages. Fruit juices with pulp. Prune juice. Soft drinks sweetened with high-fructose corn syrup or sugar alcohols. High-calorie sports drinks. Fats and oils Butter. Cream sauces. Margarine. Salad oils. Plain salad dressings. Olives. Avocados. Mayonnaise. Sweets and desserts Sweet rolls, doughnuts, and sweet breads. Sugar-free desserts sweetened with sugar alcohols such as xylitol and sorbitol. Seasoning and other foods Honey. Hot sauce. Chili powder. Gravy. Cream-based or milk-based soups. Pancakes and waffles. Summary  When you have diarrhea, the foods you eat and your eating habits are very important.  Make sure you get at least 8-10 cups of fluid each day, or enough to keep your urine clear or pale yellow.  Eat bland foods and gradually reintroduce healthy, nutrient-rich foods as tolerated, or as told by your health care provider.  Avoid high-fiber, fried, greasy, or spicy foods. This information is not intended to replace advice given to you by your health care provider. Make sure you discuss any questions you have with your health care provider. Document Released: 03/07/2004 Document Revised: 12/13/2016 Document Reviewed: 12/13/2016 Elsevier Interactive Patient Education  2017 [employer] Inc[employer]  

## 2017-06-16 ENCOUNTER — Inpatient Hospital Stay (HOSPITAL_COMMUNITY)
Admission: AD | Admit: 2017-06-16 | Discharge: 2017-06-16 | Disposition: A | Discharge status: Home / Self Care | Payer: Managed Care, Other (non HMO) | Source: Ambulatory Visit | Attending: Obstetrics and Gynecology | Admitting: Obstetrics and Gynecology

## 2017-06-16 ENCOUNTER — Encounter (HOSPITAL_COMMUNITY): Payer: Self-pay

## 2017-06-16 DIAGNOSIS — Z809 Family history of malignant neoplasm, unspecified: Secondary | ICD-10-CM | POA: Insufficient documentation

## 2017-06-16 DIAGNOSIS — Z79899 Other long term (current) drug therapy: Secondary | ICD-10-CM | POA: Insufficient documentation

## 2017-06-16 DIAGNOSIS — Z833 Family history of diabetes mellitus: Secondary | ICD-10-CM | POA: Insufficient documentation

## 2017-06-16 DIAGNOSIS — Z825 Family history of asthma and other chronic lower respiratory diseases: Secondary | ICD-10-CM | POA: Insufficient documentation

## 2017-06-16 DIAGNOSIS — O479 False labor, unspecified: Secondary | ICD-10-CM

## 2017-06-16 DIAGNOSIS — Z3483 Encounter for supervision of other normal pregnancy, third trimester: Secondary | ICD-10-CM | POA: Insufficient documentation

## 2017-06-16 DIAGNOSIS — Z3A39 39 weeks gestation of pregnancy: Secondary | ICD-10-CM | POA: Insufficient documentation

## 2017-06-16 DIAGNOSIS — Z8249 Family history of ischemic heart disease and other diseases of the circulatory system: Secondary | ICD-10-CM | POA: Insufficient documentation

## 2017-06-16 LAB — POCT FERN TEST

## 2017-06-16 NOTE — MAU Note (Signed)
Pt states around 2000, she stood up from the couch and had some fluid run down her legs. States that she has not had any since. States she also has some mucous discharge. Pt denies vaginal bleeding. Reports occasional contractions. Reports good fetal movement.

## 2017-06-16 NOTE — MAU Provider Note (Addendum)
  History     CSN: [id number]  Arrival date and time: 06/16/17 2008   None     Chief Complaint  Patient presents with  . Rupture of Membranes   HPI 26 yo G1P0 at 445w6d presenting for the evaluation of leakage of fluid. Patient reports noticing fluid running down her leg around 1 hour ago. Patient denies any further leakage of fluid. She denies any contractions or vaginal bleeding. She reports good fetal movement. She [facility]   Past Medical History:  Diagnosis Date  . Migraine headache from childhood    Past Surgical History:  Procedure Laterality Date  . NO PAST SURGERIES      Family History  Problem Relation Age of Onset  . Asthma Neg Hx   . Cancer Neg Hx   . Diabetes Neg Hx   . Hypertension Neg Hx     Social History  Substance Use Topics  . Smoking status: Never Smoker  . Smokeless tobacco: Never Used  . Alcohol use No    Allergies: No Known Allergies  Prescriptions Prior to Admission  Medication Sig Dispense Refill Last Dose  . acetaminophen (TYLENOL) 160 MG/5ML liquid Take 325 mg by mouth every 4 (four) hours as needed for pain.   prn at Unknown time  . clotrimazole (GYNE-LOTRIMIN) 1 % vaginal cream Place 1 Applicatorful vaginally at bedtime.   03/31/2017 at Unknown time  . cyclobenzaprine (FLEXERIL) 10 MG tablet Take 1 tablet (10 mg total) by mouth every 8 (eight) hours as needed for muscle spasms. 30 tablet 1 Past 2 Month at Unknown time  . nitrofurantoin, macrocrystal-monohydrate, (MACROBID) 100 MG capsule Take 100 mg by mouth 2 (two) times daily.   04/01/2017 at 1200  . ondansetron (ZOFRAN ODT) 8 MG disintegrating tablet Take 1 tablet (8 mg total) by mouth every 8 (eight) hours as needed for nausea or vomiting. 20 tablet 0     Review of Systems  See pertinent in HPI Physical Exam   Blood pressure 121/74, pulse 70, temperature 98.1 F (36.7 C), temperature source Oral, resp. rate 16, height 5\' 6"  (1.676  m), weight 189 lb (85.7 kg), last menstrual period 09/10/2016, SpO2 100 %.  Physical Exam GENERAL: Well-developed, well-nourished female in no acute distress.  LUNGS: Clear to auscultation bilaterally.  HEART: Regular rate and rhythm. ABDOMEN: Soft, nontender, gravid PELVIC: Normal external female genitalia. Vagina is pink and rugated.  Normal discharge. No pooling, neg ferning Cervix: closed/ thick/long EXTREMITIES: No cyanosis, clubbing, or edema, 2+ distal pulses.  FHT: baseline 135, mod variability, + accels, no decels Toco: irregular contractions  MAU Course  Procedures  MDM   Assessment and Plan  26 yo G1P0 at 685w6d without PROM or labor - Patient discharged in stable condition - Follow up for prenatal care on Thursday as scheduled - Precautions reviewed with the patient  Camilah Spillman 06/16/2017, 8:37 PM

## 2017-06-16 NOTE — Progress Notes (Signed)
G1@ 39.[redacted] wksga. Presents to triage for r/o srom.  Fern test negative done by [employer] Danielle  2041: Provider at bs assessing pt. Speculum exam done. No flluid noted. sve done. Closed.

## 2017-06-19 ENCOUNTER — Telehealth (HOSPITAL_COMMUNITY): Payer: Self-pay | Admitting: *Deleted

## 2017-06-19 NOTE — Telephone Encounter (Signed)
Preadmission screen  

## 2017-06-20 ENCOUNTER — Inpatient Hospital Stay (HOSPITAL_COMMUNITY)
Admission: AD | Admit: 2017-06-20 | Discharge: 2017-06-23 | DRG: 775 | Disposition: A | Discharge status: Home / Self Care | Payer: 59 | Source: Ambulatory Visit | Attending: Obstetrics and Gynecology | Admitting: Obstetrics and Gynecology

## 2017-06-20 ENCOUNTER — Encounter (HOSPITAL_COMMUNITY): Payer: Self-pay | Admitting: *Deleted

## 2017-06-20 ENCOUNTER — Inpatient Hospital Stay (HOSPITAL_COMMUNITY)
Admission: AD | Admit: 2017-06-20 | Discharge: 2017-06-20 | Disposition: A | Discharge status: Home / Self Care | Payer: 59 | Source: Ambulatory Visit | Attending: Family Medicine | Admitting: Family Medicine

## 2017-06-20 ENCOUNTER — Other Ambulatory Visit: Payer: Self-pay | Admitting: Advanced Practice Midwife

## 2017-06-20 DIAGNOSIS — Z3493 Encounter for supervision of normal pregnancy, unspecified, third trimester: Secondary | ICD-10-CM | POA: Diagnosis present

## 2017-06-20 DIAGNOSIS — Z3A40 40 weeks gestation of pregnancy: Secondary | ICD-10-CM | POA: Diagnosis not present

## 2017-06-20 DIAGNOSIS — O479 False labor, unspecified: Secondary | ICD-10-CM

## 2017-06-20 LAB — CBC
HEMATOCRIT: 41.3 % (ref 36.0–46.0)
HEMOGLOBIN: 14 g/dL (ref 12.0–15.0)
MCH: 31.4 pg (ref 26.0–34.0)
MCHC: 33.9 g/dL (ref 30.0–36.0)
MCV: 92.6 fL (ref 78.0–100.0)
Platelets: 172 10*3/uL (ref 150–400)
RBC: 4.46 MIL/uL (ref 3.87–5.11)
RDW: 14 % (ref 11.5–15.5)
WBC: 10.4 10*3/uL (ref 4.0–10.5)

## 2017-06-20 LAB — URINALYSIS, ROUTINE W REFLEX MICROSCOPIC
BILIRUBIN URINE: NEGATIVE
GLUCOSE, UA: NEGATIVE mg/dL
KETONES UR: NEGATIVE mg/dL
Nitrite: NEGATIVE
PH: 7 (ref 5.0–8.0)
Protein, ur: NEGATIVE mg/dL
Specific Gravity, Urine: 1.004 — ABNORMAL LOW (ref 1.005–1.030)

## 2017-06-20 LAB — TYPE AND SCREEN
ABO/RH(D): O POS
Antibody Screen: NEGATIVE

## 2017-06-20 MED ORDER — LACTATED RINGERS IV SOLN
INTRAVENOUS | Status: DC
  Administered 2017-06-20 – 2017-06-21 (×2): via INTRAVENOUS

## 2017-06-20 MED ORDER — FLEET ENEMA 7-19 GM/118ML RE ENEM: 1.0000 | ENEMA | RECTAL | Status: DC | PRN

## 2017-06-20 MED ORDER — LIDOCAINE HCL (PF) 1 % IJ SOLN
30.0000 mL | INTRAMUSCULAR | Status: AC | PRN
  Administered 2017-06-21: 30 mL via SUBCUTANEOUS
  Filled 2017-06-20: qty 30

## 2017-06-20 MED ORDER — OXYCODONE-ACETAMINOPHEN 5-325 MG PO TABS: 2.0000 | ORAL_TABLET | ORAL | Status: DC | PRN

## 2017-06-20 MED ORDER — OXYTOCIN 40 UNITS IN LACTATED RINGERS INFUSION - SIMPLE MED
2.5000 [IU]/h | INTRAVENOUS | Status: DC
  Filled 2017-06-20: qty 1000

## 2017-06-20 MED ORDER — LACTATED RINGERS IV SOLN: 500.0000 mL | INTRAVENOUS | Status: DC | PRN

## 2017-06-20 MED ORDER — PENICILLIN G POT IN DEXTROSE 60000 UNIT/ML IV SOLN
3.0000 10*6.[IU] | INTRAVENOUS | Status: DC
  Filled 2017-06-20: qty 50

## 2017-06-20 MED ORDER — OXYCODONE-ACETAMINOPHEN 5-325 MG PO TABS: 1.0000 | ORAL_TABLET | ORAL | Status: DC | PRN

## 2017-06-20 MED ORDER — ACETAMINOPHEN 325 MG PO TABS: 650.0000 mg | ORAL_TABLET | ORAL | Status: DC | PRN

## 2017-06-20 MED ORDER — SOD CITRATE-CITRIC ACID 500-334 MG/5ML PO SOLN: 30.0000 mL | ORAL | Status: DC | PRN

## 2017-06-20 MED ORDER — ONDANSETRON HCL 4 MG/2ML IJ SOLN: 4.0000 mg | Freq: Four times a day (QID) | INTRAMUSCULAR | Status: DC | PRN

## 2017-06-20 MED ORDER — PENICILLIN G POTASSIUM 5000000 UNITS IJ SOLR
5.0000 10*6.[IU] | Freq: Once | INTRAVENOUS | Status: DC
  Filled 2017-06-20: qty 5

## 2017-06-20 MED ORDER — OXYTOCIN BOLUS FROM INFUSION
500.0000 mL | Freq: Once | INTRAVENOUS | Status: AC
  Administered 2017-06-21: 500 mL via INTRAVENOUS

## 2017-06-20 NOTE — MAU Note (Signed)
Pt seen in MAU earlier today, DC'd home.  States uc's are much more intense, denies bleeding or LOF.

## 2017-06-20 NOTE — MAU Note (Signed)
C/o ucs since 0400 this Am; denies SROM; denies any vaginal bleeding;

## 2017-06-20 NOTE — Progress Notes (Signed)
S: Patient seen & examined for progress of labor. Patient currently comfortable, not feeling contractions. Asks for intermittent monitoring to be able to walk around.   Dilation: 4 Effacement (%): 70 Station: -2 Presentation: Vertex Exam by:: Morrison Oldee Carter RN   FHT: 130 bpm, mod var, +accels, no decels TOCO: q613min   A/P: Labor: progressing normally. Intermittent monitoring during latent labor, 20 min/hr as long as tracing is category I Pain: IV meds FWB: Category I   Continue expectant management Anticipate SVD  Howard PouchLauren Hannah Crill, MD PGY-1 [facility]

## 2017-06-20 NOTE — H&P (Signed)
Barbara Mcfarland is a 26 y.o. female G1P0 at 40.3 weeks presenting for spontaneous onset of labor. She reports contractions that started at 0400 and got worse at 1530. She denies leaking or vaginal bleeding. Reports good fetal movement. [facility] and reports no problems in the pregnancy.   OB History    Gravida Para Term Preterm AB Living   1         0   SAB TAB Ectopic Multiple Live Births                 Past Medical History:  Diagnosis Date  . Migraine headache from childhood   Past Surgical History:  Procedure Laterality Date  . NO PAST SURGERIES     Family History: family history is not on file. Social History:  reports that she has never smoked. She has never used smokeless tobacco. She reports that she does not drink alcohol or use drugs.    Maternal Diabetes: No Genetic Screening: Normal Maternal Ultrasounds/Referrals: Normal Fetal Ultrasounds or other Referrals:  None Maternal Substance Abuse:  No Significant Maternal Medications:  None Significant Maternal Lab Results:  Lab values include: Group B Strep negative Other Comments:  None  ROS History Dilation: 4 Effacement (%): 70 Station: -2 Exam by:: Coca ColaDee Carter RN Blood pressure 118/80, pulse 87, temperature 98.2 F (36.8 C), temperature source Oral, resp. rate 16, last menstrual period 09/10/2016. Exam Physical Exam  Prenatal labs: ABO, Rh: --/--/O POS (04/03 2005) Antibody: NEG (04/03 2005) Rubella: Immune (01/04 0000) RPR: Nonreactive (01/04 0000)  HBsAg: Negative (01/04 0000)  HIV: Non-reactive (01/04 0000)  GBS: Negative (05/17 0000)   Assessment/Plan: IUP at term. Spontaneous onset of labor. GBS neg.  Plan:  Admit to birthing suites  Manage expectantly  Patient plans on no medication for pain  MOF: breast  MOC: unsure, options discussed.   Cleone SlimCaroline Neill SNM 06/20/2017, 6:03 PM   CNM attestation:  I have seen and examined this patient; I agree with above  documentation in the student nurse midwife's note.   Barbara Mcfarland is a 26 y.o. G1P0 here for latent phase labor  PE: BP 127/80   Pulse 71   Temp 99.2 F (37.3 C) (Oral)   Resp 16   Ht 5\' 6"  (1.676 m)   Wt 84.4 kg (186 lb)   LMP 09/10/2016   BMI 30.02 kg/m   Resp: normal effort, no distress Abd: gravid  ROS, labs, PMH reviewed  [facility]: [facility] Expectant management for now Anticipate SVD  Cam HaiSHAW, KIMBERLY CNM 06/20/2017, 10:41 PM

## 2017-06-20 NOTE — Discharge Instructions (Signed)
Vaginal Delivery Vaginal delivery means that you will give birth by pushing your baby out of your birth canal (vagina). A team of health care providers will help you before, during, and after vaginal delivery. Birth experiences are unique for every woman and every pregnancy, and birth experiences vary depending on where you choose to give birth. What should I do to prepare for my baby's birth? Before your baby is born, it is important to talk with your health care provider about:  Your labor and delivery preferences. These may include: ? Medicines that you may be given. ? How you will manage your pain. This might include non-medical pain relief techniques or injectable pain relief such as epidural analgesia. ? How you and your baby will be monitored during labor and delivery. ? Who may be in the labor and delivery room with you. ? Your feelings about surgical delivery of your baby (cesarean delivery, or C-section) if this becomes necessary. ? Your feelings about receiving donated blood through an IV tube (blood transfusion) if this becomes necessary.  Whether you are able: ? To take pictures or videos of the birth. ? To eat during labor and delivery. ? To move around, walk, or change positions during labor and delivery.  What to expect after your baby is born, such as: ? Whether delayed umbilical cord clamping and cutting is offered. ? Who will care for your baby right after birth. ? Medicines or tests that may be recommended for your baby. ? Whether breastfeeding is supported in your hospital or birth center. ? How long you will be in the hospital or birth center.  How any medical conditions you have may affect your baby or your labor and delivery experience.  To prepare for your baby's birth, you should also:  Attend all of your health care visits before delivery (prenatal visits) as recommended by your health care provider. This is important.  Prepare your home for your baby's  arrival. Make sure that you have: ? Diapers. ? Baby clothing. ? Feeding equipment. ? Safe sleeping arrangements for you and your baby.  Install a car seat in your vehicle. Have your car seat checked by a certified car seat installer to make sure that it is installed safely.  Think about who will help you with your new baby at home for at least the first several weeks after delivery.  What can I expect when I arrive at the birth center or hospital? Once you are in labor and have been admitted into the hospital or birth center, your health care provider may:  Review your pregnancy history and any concerns you have.  Insert an IV tube into one of your veins. This is used to give you fluids and medicines.  Check your blood pressure, pulse, temperature, and heart rate (vital signs).  Check whether your bag of water (amniotic sac) has broken (ruptured).  Talk with you about your birth plan and discuss pain control options.  Monitoring Your health care provider may monitor your contractions (uterine monitoring) and your baby's heart rate (fetal monitoring). You may need to be monitored:  Often, but not continuously (intermittently).  All the time or for long periods at a time (continuously). Continuous monitoring may be needed if: ? You are taking certain medicines, such as medicine to relieve pain or make your contractions stronger. ? You have pregnancy or labor complications.  Monitoring may be done by:  Placing a special stethoscope or a handheld monitoring device on your abdomen to   check your baby's heartbeat, and feeling your abdomen for contractions. This method of monitoring does not continuously record your baby's heartbeat or your contractions.  Placing monitors on your abdomen (external monitors) to record your baby's heartbeat and the frequency and length of contractions. You may not have to wear external monitors all the time.  Placing monitors inside of your uterus  (internal monitors) to record your baby's heartbeat and the frequency, length, and strength of your contractions. ? Your health care provider may use internal monitors if he or she needs more information about the strength of your contractions or your baby's heart rate. ? Internal monitors are put in place by passing a thin, flexible wire through your vagina and into your uterus. Depending on the type of monitor, it may remain in your uterus or on your baby's head until birth. ? Your health care provider will discuss the benefits and risks of internal monitoring with you and will ask for your permission before inserting the monitors.  Telemetry. This is a type of continuous monitoring that can be done with external or internal monitors. Instead of having to stay in bed, you are able to move around during telemetry. Ask your health care provider if telemetry is an option for you.  Physical exam Your health care provider may perform a physical exam. This may include:  Checking whether your baby is positioned: ? With the head toward your vagina (head-down). This is most common. ? With the head toward the top of your uterus (head-up or breech). If your baby is in a breech position, your health care provider may try to turn your baby to a head-down position so you can deliver vaginally. If it does not seem that your baby can be born vaginally, your provider may recommend surgery to deliver your baby. In rare cases, you may be able to deliver vaginally if your baby is head-up (breech delivery). ? Lying sideways (transverse). Babies that are lying sideways cannot be delivered vaginally.  Checking your cervix to determine: ? Whether it is thinning out (effacing). ? Whether it is opening up (dilating). ? How low your baby has moved into your birth canal.  What are the three stages of labor and delivery?  Normal labor and delivery is divided into the following three stages: Stage 1  Stage 1 is the  longest stage of labor, and it can last for hours or days. Stage 1 includes: ? Early labor. This is when contractions may be irregular, or regular and mild. Generally, early labor contractions are more than 10 minutes apart. ? Active labor. This is when contractions get longer, more regular, more frequent, and more intense. ? The transition phase. This is when contractions happen very close together, are very intense, and may last longer than during any other part of labor.  Contractions generally feel mild, infrequent, and irregular at first. They get stronger, more frequent (about every 2-3 minutes), and more regular as you progress from early labor through active labor and transition.  Many women progress through stage 1 naturally, but you may need help to continue making progress. If this happens, your health care provider may talk with you about: ? Rupturing your amniotic sac if it has not ruptured yet. ? Giving you medicine to help make your contractions stronger and more frequent.  Stage 1 ends when your cervix is completely dilated to 4 inches (10 cm) and completely effaced. This happens at the end of the transition phase. Stage 2  Once   your cervix is completely effaced and dilated to 4 inches (10 cm), you may start to feel an urge to push. It is common for the body to naturally take a rest before feeling the urge to push, especially if you received an epidural or certain other pain medicines. This rest period may last for up to 1-2 hours, depending on your unique labor experience.  During stage 2, contractions are generally less painful, because pushing helps relieve contraction pain. Instead of contraction pain, you may feel stretching and burning pain, especially when the widest part of your baby's head passes through the vaginal opening (crowning).  Your health care provider will closely monitor your pushing progress and your baby's progress through the vagina during stage 2.  Your  health care provider may massage the area of skin between your vaginal opening and anus (perineum) or apply warm compresses to your perineum. This helps it stretch as the baby's head starts to crown, which can help prevent perineal tearing. ? In some cases, an incision may be made in your perineum (episiotomy) to allow the baby to pass through the vaginal opening. An episiotomy helps to make the opening of the vagina larger to allow more room for the baby to fit through.  It is very important to breathe and focus so your health care provider can control the delivery of your baby's head. Your health care provider may have you decrease the intensity of your pushing, to help prevent perineal tearing.  After delivery of your baby's head, the shoulders and the rest of the body generally deliver very quickly and without difficulty.  Once your baby is delivered, the umbilical cord may be cut right away, or this may be delayed for 1-2 minutes, depending on your baby's health. This may vary among health care providers, hospitals, and birth centers.  If you and your baby are healthy enough, your baby may be placed on your chest or abdomen to help maintain the baby's temperature and to help you bond with each other. Some mothers and babies start breastfeeding at this time. Your health care team will dry your baby and help keep your baby warm during this time.  Your baby may need immediate care if he or she: ? Showed signs of distress during labor. ? Has a medical condition. ? Was born too early (prematurely). ? Had a bowel movement before birth (meconium). ? Shows signs of difficulty transitioning from being inside the uterus to being outside of the uterus. If you are planning to breastfeed, your health care team will help you begin a feeding. Stage 3  The third stage of labor starts immediately after the birth of your baby and ends after you deliver the placenta. The placenta is an organ that develops  during pregnancy to provide oxygen and nutrients to your baby in the womb.  Delivering the placenta may require some pushing, and you may have mild contractions. Breastfeeding can stimulate contractions to help you deliver the placenta.  After the placenta is delivered, your uterus should tighten (contract) and become firm. This helps to stop bleeding in your uterus. To help your uterus contract and to control bleeding, your health care provider may: ? Give you medicine by injection, through an IV tube, by mouth, or through your rectum (rectally). ? Massage your abdomen or perform a vaginal exam to remove any blood clots that are left in your uterus. ? Empty your bladder by placing a thin, flexible tube (catheter) into your bladder. ? Encourage   you to breastfeed your baby. After labor is over, you and your baby will be monitored closely to ensure that you are both healthy until you are ready to go home. Your health care team will teach you how to care for yourself and your baby. This information is not intended to replace advice given to you by your health care provider. Make sure you discuss any questions you have with your health care provider. Document Released: 09/24/2008 Document Revised: 07/05/2016 Document Reviewed: 12/31/2015 Elsevier Interactive Patient Education  2018 Elsevier Inc[employer]  

## 2017-06-21 ENCOUNTER — Encounter (HOSPITAL_COMMUNITY): Payer: Self-pay | Admitting: *Deleted

## 2017-06-21 DIAGNOSIS — Z3A40 40 weeks gestation of pregnancy: Secondary | ICD-10-CM

## 2017-06-21 LAB — RPR: RPR Ser Ql: NONREACTIVE

## 2017-06-21 MED ORDER — ACETAMINOPHEN 325 MG PO TABS: 650.0000 mg | ORAL_TABLET | ORAL | Status: DC | PRN

## 2017-06-21 MED ORDER — ZOLPIDEM TARTRATE 5 MG PO TABS: 5.0000 mg | ORAL_TABLET | Freq: Every evening | ORAL | Status: DC | PRN

## 2017-06-21 MED ORDER — WITCH HAZEL-GLYCERIN EX PADS: 1.0000 "application " | MEDICATED_PAD | CUTANEOUS | Status: DC | PRN

## 2017-06-21 MED ORDER — SODIUM CHLORIDE 0.9% FLUSH: 3.0000 mL | Freq: Two times a day (BID) | INTRAVENOUS | Status: DC

## 2017-06-21 MED ORDER — GI COCKTAIL ~~LOC~~
30.0000 mL | Freq: Once | ORAL | Status: AC
  Administered 2017-06-21: 30 mL via ORAL
  Filled 2017-06-21: qty 30

## 2017-06-21 MED ORDER — ONDANSETRON HCL 4 MG/2ML IJ SOLN
4.0000 mg | INTRAMUSCULAR | Status: DC | PRN
Start: 2017-06-21 — End: 2017-06-23

## 2017-06-21 MED ORDER — MISOPROSTOL 200 MCG PO TABS
800.0000 ug | ORAL_TABLET | Freq: Once | ORAL | Status: AC
  Administered 2017-06-21: 800 ug via RECTAL

## 2017-06-21 MED ORDER — PRENATAL MULTIVITAMIN CH
1.0000 | ORAL_TABLET | Freq: Every day | ORAL | Status: DC
  Administered 2017-06-22 – 2017-06-23 (×2): 1 via ORAL
  Filled 2017-06-21 (×2): qty 1

## 2017-06-21 MED ORDER — SODIUM CHLORIDE 0.9% FLUSH: 3.0000 mL | INTRAVENOUS | Status: DC | PRN

## 2017-06-21 MED ORDER — MISOPROSTOL 200 MCG PO TABS
ORAL_TABLET | ORAL | Status: AC
  Filled 2017-06-21: qty 4

## 2017-06-21 MED ORDER — ONDANSETRON HCL 4 MG PO TABS: 4.0000 mg | ORAL_TABLET | ORAL | Status: DC | PRN

## 2017-06-21 MED ORDER — FENTANYL CITRATE (PF) 100 MCG/2ML IJ SOLN
INTRAMUSCULAR | Status: AC
  Filled 2017-06-21: qty 2

## 2017-06-21 MED ORDER — DOCUSATE SODIUM 100 MG PO CAPS
100.0000 mg | ORAL_CAPSULE | Freq: Every day | ORAL | Status: DC
  Administered 2017-06-22 – 2017-06-23 (×2): 100 mg via ORAL
  Filled 2017-06-21 (×2): qty 1

## 2017-06-21 MED ORDER — BENZOCAINE-MENTHOL 20-0.5 % EX AERO
1.0000 "application " | INHALATION_SPRAY | CUTANEOUS | Status: DC | PRN
  Filled 2017-06-21: qty 56

## 2017-06-21 MED ORDER — SODIUM CHLORIDE 0.9 % IV SOLN: 250.0000 mL | INTRAVENOUS | Status: DC | PRN

## 2017-06-21 MED ORDER — DIBUCAINE 1 % RE OINT: 1.0000 "application " | TOPICAL_OINTMENT | RECTAL | Status: DC | PRN

## 2017-06-21 MED ORDER — TETANUS-DIPHTH-ACELL PERTUSSIS 5-2.5-18.5 LF-MCG/0.5 IM SUSP: 0.5000 mL | Freq: Once | INTRAMUSCULAR | Status: DC

## 2017-06-21 MED ORDER — SENNOSIDES-DOCUSATE SODIUM 8.6-50 MG PO TABS
2.0000 | ORAL_TABLET | ORAL | Status: DC
  Administered 2017-06-22 (×2): 2 via ORAL
  Filled 2017-06-21 (×2): qty 2

## 2017-06-21 MED ORDER — OXYTOCIN 40 UNITS IN LACTATED RINGERS INFUSION - SIMPLE MED: 2.5000 [IU]/h | INTRAVENOUS | Status: DC | PRN

## 2017-06-21 MED ORDER — IBUPROFEN 600 MG PO TABS: 600.0000 mg | ORAL_TABLET | Freq: Four times a day (QID) | ORAL | Status: DC | PRN

## 2017-06-21 MED ORDER — IBUPROFEN 600 MG PO TABS
600.0000 mg | ORAL_TABLET | Freq: Four times a day (QID) | ORAL | Status: DC
  Administered 2017-06-21 – 2017-06-23 (×7): 600 mg via ORAL
  Filled 2017-06-21 (×8): qty 1

## 2017-06-21 MED ORDER — DIPHENHYDRAMINE HCL 25 MG PO CAPS: 25.0000 mg | ORAL_CAPSULE | Freq: Four times a day (QID) | ORAL | Status: DC | PRN

## 2017-06-21 MED ORDER — COCONUT OIL OIL: 1.0000 "application " | TOPICAL_OIL | Status: DC | PRN

## 2017-06-21 MED ORDER — SIMETHICONE 80 MG PO CHEW: 80.0000 mg | CHEWABLE_TABLET | ORAL | Status: DC | PRN

## 2017-06-21 MED ORDER — FENTANYL CITRATE (PF) 100 MCG/2ML IJ SOLN
100.0000 ug | INTRAMUSCULAR | Status: DC | PRN
  Administered 2017-06-21 (×4): 100 ug via INTRAVENOUS
  Filled 2017-06-21 (×3): qty 2

## 2017-06-21 MED ORDER — POLYETHYLENE GLYCOL 3350 17 G PO PACK
17.0000 g | PACK | Freq: Every day | ORAL | Status: DC
  Administered 2017-06-22 – 2017-06-23 (×2): 17 g via ORAL
  Filled 2017-06-21 (×5): qty 1

## 2017-06-21 NOTE — Lactation Note (Signed)
This note was copied from a baby's chart. Lactation Consultation Note  Patient Name: Barbara Mcfarland BarefootSaray Lopez ZOXWR'UToday's Date: 06/21/2017 Reason for consult: Initial assessment  Initial visit at 8 hours of life. Infant has fed once & Mom has had attempts, but infant is currently sleeping. Mom was educated about initial (sleepy) newborn behavior. Hand expression was taught to Mom & we gave the drops of colostrum to "Genesis" with a gloved finger. I recommended that Mom unwrap her to try & wake her to feed. If latching is not successful, she, too, can hand express & give the colostrum to Genesis.   Mom reported + breast changes w/pregnancy. Mom made aware of O/P services, breastfeeding support groups, community resources, and our phone # for post-discharge questions.   Lurline HareRichey, Abdulrahim Siddiqi [facility] 06/21/2017, 5:38 PM

## 2017-06-21 NOTE — Progress Notes (Signed)
Labor Progress Note  Barbara Mcfarland is a 26 y.o. G1P0 at 6364w4d  admitted for SOL. Patient feeling contractions this AM.  O:  BP 123/78 (BP Location: Left Arm)   Pulse 62   Temp 98.8 F (37.1 C) (Oral)   Resp 16   Ht 5\' 6"  (1.676 m)   Wt 84.4 kg (186 lb)   LMP 09/10/2016   BMI 30.02 kg/m   No intake/output data recorded.  FHT: 145 bpm, mod variability, accels, no decels UC:   q2-2135m SVE:   Dilation: 6.5 Effacement (%): 70 Station: -2 Exam by:: FiservCatie Sullivan RN   Labs: Lab Results  Component Value Date   WBC 10.4 06/20/2017   HGB 14.0 06/20/2017   HCT 41.3 06/20/2017   MCV 92.6 06/20/2017   PLT 172 06/20/2017    Assessment / Plan: 26 y.o. G1P0 4464w4d admitted for SOL  Labor: Progressing normally Fetal Wellbeing:  Category I Pain Control:  IV pain meds Anticipated MOD:  NSVD  Expectant management  Howard PouchLauren Ladarrion Telfair, MD PGY-1 [facility]

## 2017-06-22 NOTE — Progress Notes (Signed)
POSTPARTUM PROGRESS NOTE  Post Partum Day #1 SVD  Subjective:  Barbara Mcfarland is a 26 y.o. G1P1001 7454w4d s/p SVD.  No acute events overnight.  Pt reports chest heaviness when she first gets up to walk which resolves after she is up. She denies issues with voiding or po intake.  She denies nausea or vomiting.  Pain is well controlled.  She has had flatus. She has not had bowel movement.  Lochia Minimal.   Objective: Blood pressure 116/78, pulse 81, temperature 98.7 F (37.1 C), temperature source Tympanic, resp. rate 16, height 5\' 6"  (1.676 m), weight 84.4 kg (186 lb), last menstrual period 09/10/2016, unknown if currently breastfeeding.  Physical Exam:  General: alert, cooperative and no distress Lochia:normal flow Uterine Fundus: firm, below umbilicus DVT Evaluation: No calf swelling or tenderness Extremities: no LE edema   Recent Labs  06/20/17 1835  HGB 14.0  HCT 41.3    Assessment/Plan:  ASSESSMENT: Barbara Mcfarland is a 26 y.o. G1P1001 8054w4d s/p SVD - encourage ambulation  Plan for discharge tomorrow   LOS: 2 days   Howard PouchLauren Feng, MD PGY-1 [facility]  06/22/2017, 7:29 AM   OB FELLOW POSTPARTUM PROGRESS NOTE ATTESTATION  I confirm that I have verified the information documented in the resident's note and that I have also personally reperformed the physical exam and all medical decision making activities.     Ernestina PennaNicholas Alexis Reber, MD 9:31 AM

## 2017-06-23 MED ORDER — IBUPROFEN 600 MG PO TABS: 600.0000 mg | ORAL_TABLET | Freq: Four times a day (QID) | ORAL | 0 refills | Status: DC

## 2017-06-23 MED ORDER — SENNOSIDES-DOCUSATE SODIUM 8.6-50 MG PO TABS: 2.0000 | ORAL_TABLET | Freq: Every evening | ORAL | 0 refills | Status: DC | PRN

## 2017-06-23 MED ORDER — POLYETHYLENE GLYCOL 3350 17 G PO PACK: 17.0000 g | PACK | Freq: Every day | ORAL | 0 refills | Status: DC

## 2017-06-23 NOTE — Discharge Summary (Signed)
OB Discharge Summary     Patient Name: Barbara Mcfarland DOB: 09-18-1991 MRN: [id number]  Date of admission: 06/20/2017 Delivering MD: Jen Mow [facility]   Date of discharge: 06/23/2017  Admitting diagnosis: 40 wks ctx every 3 min alot of pressure Intrauterine pregnancy: [redacted]w[redacted]d     Secondary diagnosis:  Active Problems:   Normal labor  Additional problems:  Patient Active Problem List   Diagnosis Date Noted  . Normal labor 06/20/2017  . Preterm labor in third trimester 04/02/2017  . Threatened preterm labor, third trimester 04/01/2017  . Raynaud phenomenon 02/20/2016  . Migraine headache 11/15/2013        Discharge diagnosis: Term Pregnancy Delivered                                                                                                Augmentation: None  Complications: None  Hospital course:  Onset of Labor With Vaginal Delivery     26 y.o. yo G1P1001 at [redacted]w[redacted]d was admitted in Active Labor on 06/20/2017. Patient had an uncomplicated labor course as follows:  Membrane Rupture Time/Date: 8:34 AM ,06/21/2017   Intrapartum Procedures: Episiotomy: None [1]                                         Lacerations:  3rd degree [4]  Patient had a delivery of a Viable infant. 06/21/2017  Information for the patient's newborn:  [place], First [[id number]]  Delivery Method: Vag-Spont    Pateint had an uncomplicated postpartum course.  She is ambulating, tolerating a regular diet, passing flatus, and urinating well. Patient is discharged home in stable condition on 06/23/17.   Physical exam  Vitals:   06/21/17 1830 06/22/17 0629 06/22/17 1806 06/23/17 0624  BP: 112/62 116/78 (!) 101/51 114/64  Pulse: 84 81 62 70  Resp: 16 16 18 18   Temp: 100.1 F (37.8 C) 98.7 F (37.1 C) 98.5 F (36.9 C) 98.1 F (36.7 C)  TempSrc: Oral Tympanic  Oral  Weight:      Height:       General: alert, cooperative and no distress Lochia: appropriate Uterine Fundus: firm Incision:  N/A DVT Evaluation: No evidence of DVT seen on physical exam. Labs: Lab Results  Component Value Date   WBC 10.4 06/20/2017   HGB 14.0 06/20/2017   HCT 41.3 06/20/2017   MCV 92.6 06/20/2017   PLT 172 06/20/2017   CMP Latest Ref Rng & Units 11/15/2013  Glucose 70 - 99 mg/dL 782(N)  BUN 6 - 23 mg/dL 13  Creatinine 5.62 - 1.30 mg/dL 8.65  Sodium 784 - 696 mEq/L 136  Potassium 3.5 - 5.3 mEq/L 4.1  Chloride 96 - 112 mEq/L 102  CO2 19 - 32 mEq/L 26  Calcium 8.4 - 10.5 mg/dL 9.7  Total Protein 6.0 - 8.3 g/dL 7.9  Total Bilirubin 0.3 - 1.2 mg/dL 0.5  Alkaline Phos 39 - 117 U/L 82  AST 0 - 37 U/L 13  ALT 0 - 35 U/L 12  Discharge instruction: per After Visit Summary and "Baby and Me Booklet".  After visit meds:  Allergies as of 06/23/2017   No Known Allergies     Medication List    STOP taking these medications   ondansetron 8 MG disintegrating tablet Commonly known as:  ZOFRAN ODT     TAKE these medications   calcium carbonate 500 MG chewable tablet Commonly known as:  TUMS - dosed in mg elemental calcium Chew 1 tablet by mouth at bedtime as needed for indigestion or heartburn.   ibuprofen 600 MG tablet Commonly known as:  ADVIL,MOTRIN Take 1 tablet (600 mg total) by mouth every 6 (six) hours.   prenatal multivitamin Tabs tablet Take 1 tablet by mouth daily at 12 noon.   senna-docusate 8.6-50 MG tablet Commonly known as:  Senokot-S Take 2 tablets by mouth at bedtime as needed for mild constipation.       Diet: routine diet  Activity: Advance as tolerated. Pelvic rest for 6 weeks.   Outpatient follow up:6 weeks Follow up Appt:No future appointments. Follow up Visit:No Follow-up on file.  Postpartum contraception: Undecided, considering LARCs  Newborn Data: Live born female  Birth Weight: 8 lb 5.2 oz (3775 g) APGAR: 9, 9  Baby Feeding: Breast Disposition:home with mother   06/23/2017 Howard PouchLauren Feng, MD  OB FELLOW DISCHARGE ATTESTATION  I confirm  that I have verified the information documented in the resident's note and that I have also personally reperformed the physical exam and all medical decision making activities.     Ernestina PennaNicholas Schenk, MD 7:48 AM

## 2017-06-23 NOTE — Lactation Note (Signed)
This note was copied from a baby's chart. Lactation Consultation Note  Patient Name: Barbara Mcfarland [name]: 06/23/2017 Reason for consult: Follow-up assessment Baby at 48 hr of life and dyad set for d/c today. Mom has bilateral compression stripe scabs. She has ben feeding in sideline position. Had mom recline and sue cross cradle. Baby was able to get a quick, comfortable latch with multiple swallows noted. Given comfort gels and mom has Harmony to take home. Mom's breast are starting to fill. She plans to f/u with WIC this week. Baby has a noticeable lingual frenulum, with a heat shaped tongue tip with mild movement. Baby has a high rounded palate. It appears that baby can extend tongue slightly over gum ridge, lift tongue almost to midline, and has some laterization of the tongue. It is unclear to lactation at this time if the nipple trauma was from poor placement at the breast or a possible oral restriction, further f/u is required. She will call for OP apt if bf does not improve over the next 2-3 days. Discussed baby behavior, feeding frequency, baby belly size, voids, wt loss, breast changes, and nipple care. Mom is aware of lactation services and support group. She will offer the breast on demand 8+/24hr, post pump, and offer expressed milk a cup or spoon. She will f/u with lactation as needed.    Maternal Data    Feeding Feeding Type: Breast Fed  LATCH Score/Interventions Latch: Grasps breast easily, tongue down, lips flanged, rhythmical sucking. Intervention(s): Adjust position;Breast compression  Audible Swallowing: Spontaneous and intermittent Intervention(s): Hand expression  Type of Nipple: Everted at rest and after stimulation  Comfort (Breast/Nipple): Filling, red/small blisters or bruises, mild/mod discomfort  Problem noted: Mild/Moderate discomfort;Cracked, bleeding, blisters, bruises;Filling Interventions (Filling): Frequent nursing;Hand pump Interventions   (Cracked/bleeding/bruising/blister): Expressed breast milk to nipple Interventions (Mild/moderate discomfort): Comfort gels  Hold (Positioning): Assistance needed to correctly position infant at breast and maintain latch. Intervention(s): Position options;Support Pillows  LATCH Score: 8  Lactation Tools Discussed/Used     Consult Status Consult Status: Complete Follow-up type: Call as needed    Barbara Mcfarland 06/23/2017, 9:37 AM

## 2017-06-23 NOTE — Clinical Social Work Maternal (Signed)
  CLINICAL SOCIAL WORK MATERNAL/CHILD NOTE  Patient Details  Name: Barbara Mcfarland MRN: [id number] Date of Birth: [birth year]  Date:  06/23/2017  Clinical Social Worker Initiating Note:  Laurey Arrow Date/ Time Initiated:  06/23/17/1059     Child's Name:  Barbara Mcfarland   Legal Guardian:  Mother (Per MOB, FOB will not be involved)   Need for Interpreter:  None   Date of Referral:  06/23/17     Reason for Referral:  Behavioral Health Issues, including SI  (MOB scored a 10 on the PHQ9 on 12/27/16.)   Referral Source:  [facility]   Address:  [place] [address]  Phone number:  [phone]   Household Members:  Self, Siblings, Parents   Natural Supports (not living in the home):  Immediate Family   Professional Supports: None   Employment: Unemployed   Type of Work:     Education:  Database administrator Resources:  Multimedia programmer   Other Resources:      Cultural/Religious Considerations Which May Impact Care:  None Reported  Strengths:  Ability to meet basic needs , Home prepared for child , Understanding of illness   Risk Factors/Current Problems:  Mental Health Concerns    Cognitive State:  Alert , Able to Concentrate , Linear Thinking , Insightful    Mood/Affect:  Calm , Tearful , Happy , Interested , Comfortable    CSW Assessment: CSW met with MOB to complete an assessment for depression.  When MOB arrived, MOB was in bed bonding with infant as evident by engaging in skin to skin. MOB gave CSW permission to meet with MOB while MOB's mother was present. CSW inquired about MOB's MH.  MOB denied a MH hx but acknowledged a high score on the PHQ9 during pregnancy. MOB denied any depression signs and symptoms. CSW provided education regarding Baby Blues vs PMADs and provided MOB with information about support groups held at [facility] encouraged MOB to evaluate her mental health throughout the postpartum period with  the use of the New Mom Checklist developed by [facility]partum [facility] and notify a medical professional if symptoms arise.  MOB did not present with any acute mental health symptoms, and presents with insight and self-awareness related to her mental health needs. MOB denied HI, SI, and DV.   MOB also requested information regarding Medicaid enrollment for infant.  CSW left the [facility] department a message regarding assisting MOB with Medicaid enrollment for infant.   There are no barriers to d/c.   CSW Plan/Description:  Information/Referral to [employer] , [employer] , No Further Intervention Required/No Barriers to Discharge   Laurey Arrow, MSW, LCSW Clinical Social Work [phone]  Dimple Nanas, LCSW 06/23/2017, 11:02 AM

## 2017-06-23 NOTE — Discharge Instructions (Signed)

## 2017-06-24 ENCOUNTER — Inpatient Hospital Stay (HOSPITAL_COMMUNITY): Admission: RE | Admit: 2017-06-24 | Payer: 59 | Source: Ambulatory Visit

## 2018-04-09 DIAGNOSIS — R87610 Atypical squamous cells of undetermined significance on cytologic smear of cervix (ASC-US): Secondary | ICD-10-CM

## 2018-04-09 DIAGNOSIS — R87810 Cervical high risk human papillomavirus (HPV) DNA test positive: Principal | ICD-10-CM

## 2018-04-09 HISTORY — DX: Atypical squamous cells of undetermined significance on cytologic smear of cervix (ASC-US): R87.610

## 2018-04-21 ENCOUNTER — Encounter (HOSPITAL_COMMUNITY): Payer: Self-pay

## 2018-04-21 ENCOUNTER — Other Ambulatory Visit: Payer: Self-pay

## 2018-04-21 ENCOUNTER — Emergency Department (HOSPITAL_COMMUNITY)
Admission: EM | Admit: 2018-04-21 | Discharge: 2018-04-21 | Disposition: A | Discharge status: Home / Self Care | Payer: 59 | Attending: Emergency Medicine | Admitting: Emergency Medicine

## 2018-04-21 DIAGNOSIS — M791 Myalgia, unspecified site: Secondary | ICD-10-CM | POA: Diagnosis present

## 2018-04-21 DIAGNOSIS — J020 Streptococcal pharyngitis: Secondary | ICD-10-CM | POA: Diagnosis not present

## 2018-04-21 MED ORDER — METOCLOPRAMIDE HCL 10 MG PO TABS
10.0000 mg | ORAL_TABLET | Freq: Once | ORAL | Status: AC
  Administered 2018-04-21: 10 mg via ORAL
  Filled 2018-04-21: qty 1

## 2018-04-21 MED ORDER — KETOROLAC TROMETHAMINE 30 MG/ML IJ SOLN: 30.0000 mg | Freq: Once | INTRAMUSCULAR | Status: DC

## 2018-04-21 MED ORDER — PENICILLIN G BENZATHINE 1200000 UNIT/2ML IM SUSP
1.2000 10*6.[IU] | Freq: Once | INTRAMUSCULAR | Status: AC
  Administered 2018-04-21: 1.2 10*6.[IU] via INTRAMUSCULAR
  Filled 2018-04-21: qty 2

## 2018-04-21 MED ORDER — METOCLOPRAMIDE HCL 10 MG PO TABS: 10.0000 mg | ORAL_TABLET | Freq: Four times a day (QID) | ORAL | 0 refills | Status: DC | PRN

## 2018-04-21 MED ORDER — ONDANSETRON 4 MG PO TBDP: 4.0000 mg | ORAL_TABLET | Freq: Once | ORAL | Status: DC

## 2018-04-21 MED ORDER — IBUPROFEN 400 MG PO TABS
600.0000 mg | ORAL_TABLET | Freq: Once | ORAL | Status: AC
Start: 2018-04-21 — End: 2018-04-21
  Administered 2018-04-21: 16:00:00 600 mg via ORAL
  Filled 2018-04-21: qty 1

## 2018-04-21 NOTE — ED Triage Notes (Signed)
Pt states she started having body pain last night with chills. C/O headache with NV. Denies diarrhea.

## 2018-04-21 NOTE — Discharge Instructions (Addendum)
Please read instructions below.  You have been treated with penicillin for strep throat. You can take tylenol or ibuprofen as needed for sore throat or fever.  Drink plenty of water.  You can take reglan every 6 hours as needed for nausea. Follow up with your primary care provider as needed if symptoms persist.  Return to the ER for inability to swallow liquids, difficulty breathing, or new or worsening symptoms.

## 2018-04-21 NOTE — ED Provider Notes (Signed)
[facility] Provider Note   CSN: [id number] Arrival date & time: 04/21/18  1317     History   Chief Complaint Chief Complaint  Patient presents with  . Generalized Body Aches    HPI Barbara Mcfarland is a 27 y.o. female presenting to the ED with acute onset of sore throat and body aches that began yesterday.  Patient states she has associated dysphagia.  Reports generalized myalgias with headache, chills, and nausea and vomiting.  She is taking over-the-counter medications for symptom relief.  Has not checked her temperature for fever.  Denies nasal congestion or rhinorrhea, ear pain, cough, abdominal pain, or other complaints.  States she is currently breast-feeding.  The history is provided by the patient.    Past Medical History:  Diagnosis Date  . Migraine headache from childhood    Patient Active Problem List   Diagnosis Date Noted  . Normal labor 06/20/2017  . Preterm labor in third trimester 04/02/2017  . Threatened preterm labor, third trimester 04/01/2017  . Raynaud phenomenon 02/20/2016  . Migraine headache 11/15/2013    Past Surgical History:  Procedure Laterality Date  . NO PAST SURGERIES       OB History    Gravida  1   Para  1   Term  1   Preterm      AB      Living  1     SAB      TAB      Ectopic      Multiple  0   Live Births  1            Home Medications    Prior to Admission medications   Medication Sig Start Date End Date Taking? Authorizing Provider  calcium carbonate (TUMS - DOSED IN MG ELEMENTAL CALCIUM) 500 MG chewable tablet Chew 1 tablet by mouth at bedtime as needed for indigestion or heartburn.    [provider]  ibuprofen (ADVIL,MOTRIN) 600 MG tablet Take 1 tablet (600 mg total) by mouth every 6 (six) hours. 06/23/17   Howard PouchFeng, Lauren, MD  metoCLOPramide (REGLAN) 10 MG tablet Take 1 tablet (10 mg total) by mouth every 6 (six) hours as needed for nausea. 04/21/18   Zakya Halabi,  [place] N, PA-C  polyethylene glycol (MIRALAX / GLYCOLAX) packet Take 17 g by mouth daily. 06/23/17   Lorne SkeensSchenk, Nicholas Michael, MD  Prenatal Vit-Fe Fumarate-FA (PRENATAL MULTIVITAMIN) TABS tablet Take 1 tablet by mouth daily at 12 noon.    [provider]  senna-docusate (SENOKOT-S) 8.6-50 MG tablet Take 2 tablets by mouth at bedtime as needed for mild constipation. 06/23/17   Howard PouchFeng, Lauren, MD    Family History Family History  Problem Relation Age of Onset  . Asthma Neg Hx   . Cancer Neg Hx   . Diabetes Neg Hx   . Hypertension Neg Hx     Social History Social History   Tobacco Use  . Smoking status: Never Smoker  . Smokeless tobacco: Never Used  Substance Use Topics  . Alcohol use: No    Alcohol/week: 0.0 oz  . Drug use: No     Allergies   Patient has no known allergies.   Review of Systems Review of Systems  Constitutional: Positive for chills.  HENT: Positive for sore throat. Negative for congestion, ear pain, rhinorrhea, trouble swallowing and voice change.   Respiratory: Negative for cough and shortness of breath.   Gastrointestinal: Positive for nausea and vomiting. Negative  for abdominal pain.     Physical Exam Updated Vital Signs BP 111/70 (BP Location: Right Arm)   Pulse 82   Temp 99.2 F (37.3 C)   Resp 16   Ht 5\' 6"  (1.676 m)   Wt 68 kg (150 lb)   LMP 04/13/2018   SpO2 100%   BMI 24.21 kg/m   Physical Exam  Constitutional: She appears well-developed and well-nourished. No distress.  HENT:  Head: Normocephalic and atraumatic.  Right Ear: Tympanic membrane, external ear and ear canal normal.  Left Ear: Tympanic membrane, external ear and ear canal normal.  Nose: Nose normal.  Tolerating secretions.  Pharynx is erythematous with tonsillar edema and some exudates.  Uvula midline without swelling, no trismus.  Eyes: Conjunctivae are normal.  Neck: Normal range of motion. Neck supple.  Cardiovascular: Normal rate, regular rhythm, normal heart  sounds and intact distal pulses.  Pulmonary/Chest: Effort normal and breath sounds normal. No respiratory distress.  Abdominal: Soft. Bowel sounds are normal. She exhibits no distension. There is no tenderness. There is no rebound and no guarding.  Lymphadenopathy:    She has cervical adenopathy.  Psychiatric: She has a normal mood and affect. Her behavior is normal.  Nursing note and vitals reviewed.    ED Treatments / Results  Labs (all labs ordered are listed, but only abnormal results are displayed) Labs Reviewed - No data to display  EKG None  Radiology No results found.  Procedures Procedures (including critical care time)  Medications Ordered in ED Medications  penicillin g benzathine (BICILLIN LA) 1200000 UNIT/2ML injection 1.2 Million Units (1.2 Million Units Intramuscular Given 04/21/18 1532)  ibuprofen (ADVIL,MOTRIN) tablet 600 mg (600 mg Oral Given 04/21/18 1532)  metoCLOPramide (REGLAN) tablet 10 mg (10 mg Oral Given 04/21/18 1532)     Initial Impression / Assessment and Plan / ED Course  I have reviewed the triage vital signs and the nursing notes.  Pertinent labs & imaging results that were available during my care of the patient were reviewed by me and considered in my medical decision making (see chart for details).     Pt with symptoms and exam consistent strep pharyngitis. On exam, tonsillar edema, erythema and exudate, cervical lymphadenopathy, & dysphagia. Treated in the ED with NSAIDs and PCN IM.  Pt appears mildly dehydrated, discussed importance of water rehydration. VSS. Presentation non concerning for PTA or infxn spread to soft tissue. No trismus or uvula deviation. Specific return precautions discussed. Pt able  to drink water in ED without difficulty with intact air way. Recommended PCP follow up.   Discussed results, findings, treatment and follow up. Patient advised of return precautions. Patient verbalized understanding and agreed with  plan.  Final Clinical Impressions(s) / ED Diagnoses   Final diagnoses:  Strep pharyngitis    ED Discharge Orders        Ordered    metoCLOPramide (REGLAN) 10 MG tablet  Every 6 hours PRN     04/21/18 1553       Brindle Leyba, [place] N, PA-C 04/21/18 1605    Phillis Haggis, MD 04/21/18 1609

## 2018-06-23 ENCOUNTER — Encounter: Payer: Self-pay | Admitting: Obstetrics & Gynecology

## 2018-06-23 ENCOUNTER — Ambulatory Visit (INDEPENDENT_AMBULATORY_CARE_PROVIDER_SITE_OTHER): Payer: Managed Care, Other (non HMO) | Admitting: Obstetrics & Gynecology

## 2018-06-23 ENCOUNTER — Other Ambulatory Visit (HOSPITAL_COMMUNITY)
Admission: RE | Admit: 2018-06-23 | Discharge: 2018-06-23 | Disposition: A | Discharge status: Home / Self Care | Payer: Managed Care, Other (non HMO) | Source: Ambulatory Visit | Attending: Obstetrics & Gynecology | Admitting: Obstetrics & Gynecology

## 2018-06-23 VITALS — BP 108/76 | HR 76 | Ht 66.0 in | Wt 184.9 lb

## 2018-06-23 DIAGNOSIS — Z23 Encounter for immunization: Secondary | ICD-10-CM | POA: Insufficient documentation

## 2018-06-23 DIAGNOSIS — R87810 Cervical high risk human papillomavirus (HPV) DNA test positive: Secondary | ICD-10-CM | POA: Insufficient documentation

## 2018-06-23 DIAGNOSIS — R87610 Atypical squamous cells of undetermined significance on cytologic smear of cervix (ASC-US): Secondary | ICD-10-CM | POA: Diagnosis not present

## 2018-06-23 DIAGNOSIS — N871 Moderate cervical dysplasia: Secondary | ICD-10-CM | POA: Insufficient documentation

## 2018-06-23 DIAGNOSIS — Z7185 Encounter for immunization safety counseling: Secondary | ICD-10-CM

## 2018-06-23 DIAGNOSIS — Z7189 Other specified counseling: Secondary | ICD-10-CM

## 2018-06-23 DIAGNOSIS — IMO0001 Reserved for inherently not codable concepts without codable children: Secondary | ICD-10-CM

## 2018-06-23 HISTORY — DX: Moderate cervical dysplasia: N87.1

## 2018-06-23 LAB — POCT PREGNANCY, URINE: Preg Test, Ur: NEGATIVE

## 2018-06-23 NOTE — Addendum Note (Signed)
Addended by: Judd GaudierLEVENS, Keondre Markson M on: 06/23/2018 10:22 AM   Modules accepted: Orders

## 2018-06-23 NOTE — Patient Instructions (Signed)

## 2018-06-23 NOTE — Progress Notes (Signed)
    GYNECOLOGY CLINIC COLPOSCOPY PROCEDURE NOTE  27 y.o. G1P1001 here for colposcopy for ASCUS with POSITIVE high risk [facility] (obtained report over the phone; result is being faxed urgently). Discussed role for HPV in cervical dysplasia, need for surveillance. Also counseled about HPV vaccine, patient wants to start this series today.  Patient given informed consent, signed copy in the chart, time out was performed.  Placed in lithotomy position. Cervix viewed with speculum and colposcope after application of acetic acid.   Colposcopy adequate? Yes Acetowhite lesion(s) noted at 12 and 6 o'clock; corresponding biopsies obtained.  ECC specimen obtained. All specimens were labeled and sent to pathology.   Patient was given post procedure instructions.  Will follow up pathology and manage accordingly; patient will be contacted with results and recommendations.  Gardasil #1 to be given today, will return for other ones accordingly.  Routine preventative health maintenance measures emphasized.  Return in about 2 months (around 08/23/2018) for Gardasil #2 and 6 months from today Gardasil #3 (all RN visits).    Jaynie CollinsUGONNA  Florabelle Cardin, MD, FACOG Obstetrician & Gynecologist, [facility] for [employer] [employer], [facility]

## 2018-06-25 ENCOUNTER — Encounter: Payer: Self-pay | Admitting: Obstetrics & Gynecology

## 2018-06-25 NOTE — Progress Notes (Signed)
06/23/18 Colposcopy biopsy diagnosis after ASCUS +HRHPV pap 1. Cervix, biopsy, 12 o'clock - LOW GRADE SQUAMOUS INTRAEPITHELIAL LESION, CIN-I (MILD DYSPLASIA). 2. Cervix, biopsy, 6 o'clock - HIGH GRADE SQUAMOUS INTRAEPITHELIAL LESION, CIN-III (SEVERE DYSPLASIA/CIS). 3. Endocervix, curettage, with brush - HIGH GRADE SQUAMOUS INTRAEPITHELIAL LESION, CIN-III (SEVERE DYSPLASIA/CIS). - BENIGN ENDOCERVICAL MUCOSA. Patient needs a LEEP given severe cervical dysplasia.  She may need an appointment to come in to discuss results in detail prior to LEEP; please offer this option to her.  Please call to inform patient of results and recommendations.

## 2018-06-30 ENCOUNTER — Telehealth: Payer: Self-pay

## 2018-06-30 NOTE — Telephone Encounter (Signed)
Left message for patient to call office regarding results and recommendations.  Mychart message also sent.

## 2018-06-30 NOTE — Telephone Encounter (Signed)
-----   Message from Tereso NewcomerUgonna A Anyanwu, MD sent at 06/25/2018 12:13 PM EDT ----- 06/23/18 Colposcopy biopsy diagnosis after ASCUS +HRHPV pap 1. Cervix, biopsy, 12 o'clock - LOW GRADE SQUAMOUS INTRAEPITHELIAL LESION, CIN-I (MILD DYSPLASIA). 2. Cervix, biopsy, 6 o'clock - HIGH GRADE SQUAMOUS INTRAEPITHELIAL LESION, CIN-III (SEVERE DYSPLASIA/CIS). 3. Endocervix, curettage, with brush - HIGH GRADE SQUAMOUS INTRAEPITHELIAL LESION, CIN-III (SEVERE DYSPLASIA/CIS). - BENIGN ENDOCERVICAL MUCOSA. Patient needs a LEEP given severe cervical dysplasia.  She may need an appointment to come in to discuss results in detail prior to LEEP; please offer this option to her.  Please call to inform patient of results and recommendations.

## 2018-07-01 ENCOUNTER — Encounter: Payer: Self-pay | Admitting: Obstetrics & Gynecology

## 2018-07-06 ENCOUNTER — Encounter: Payer: Self-pay | Admitting: Obstetrics & Gynecology

## 2018-07-06 ENCOUNTER — Encounter (INDEPENDENT_AMBULATORY_CARE_PROVIDER_SITE_OTHER): Payer: Self-pay

## 2018-07-09 ENCOUNTER — Ambulatory Visit: Payer: Managed Care, Other (non HMO) | Admitting: Obstetrics & Gynecology

## 2018-07-13 ENCOUNTER — Encounter: Payer: Self-pay | Admitting: Obstetrics & Gynecology

## 2018-07-14 ENCOUNTER — Encounter: Payer: Self-pay | Admitting: Obstetrics & Gynecology

## 2018-08-05 ENCOUNTER — Other Ambulatory Visit (HOSPITAL_COMMUNITY)
Admission: RE | Admit: 2018-08-05 | Discharge: 2018-08-05 | Disposition: A | Discharge status: Home / Self Care | Payer: Managed Care, Other (non HMO) | Source: Ambulatory Visit | Attending: Obstetrics & Gynecology | Admitting: Obstetrics & Gynecology

## 2018-08-05 ENCOUNTER — Encounter: Payer: Self-pay | Admitting: Obstetrics & Gynecology

## 2018-08-05 ENCOUNTER — Ambulatory Visit (INDEPENDENT_AMBULATORY_CARE_PROVIDER_SITE_OTHER): Payer: Managed Care, Other (non HMO) | Admitting: Obstetrics & Gynecology

## 2018-08-05 VITALS — BP 107/69 | HR 76 | Ht 66.0 in | Wt 153.0 lb

## 2018-08-05 DIAGNOSIS — Z3202 Encounter for pregnancy test, result negative: Secondary | ICD-10-CM | POA: Diagnosis not present

## 2018-08-05 DIAGNOSIS — N871 Moderate cervical dysplasia: Secondary | ICD-10-CM | POA: Diagnosis not present

## 2018-08-05 DIAGNOSIS — D069 Carcinoma in situ of cervix, unspecified: Secondary | ICD-10-CM | POA: Diagnosis present

## 2018-08-05 LAB — POCT PREGNANCY, URINE: Preg Test, Ur: NEGATIVE

## 2018-08-05 NOTE — Patient Instructions (Signed)
LEEP POST-PROCEDURE INSTRUCTIONS  1. You may take Ibuprofen, Aleve or Tylenol for pain if needed.  Cramping is normal.  2. You will have black and/or bloody discharge at first.  This will lighten and then turn clear before completely resolving.  This will take 2 to 3 weeks.  3. Put nothing in your vagina for 3 weeks.   4. You need to call if you have any unusual draining, if the bleeding is heavy, or if you are concerned.  5. Shower or bathe as normal  6. We will call you within one week with results or we will discuss the results at your follow-up appointment if needed.  7. You will need to return for a follow-up Pap smear as directed by your physician.

## 2018-08-05 NOTE — Progress Notes (Signed)
   GYNECOLOGY OFFICE PROCEDURE NOTE  [name] is a 27 y.o. G1P1001 here for LEEP.  No acute GYN concerns. Pap smear and colposcopy history reviewed.    [facility] Colposcopy on 06/23/2018 Biopsy CIN III and ECC CIN III  Risks, benefits, alternatives, and limitations of procedure explained to patient, including pain, bleeding, infection, failure to remove abnormal tissue and failure to cure dysplasia, need for repeat procedures, damage to pelvic organs, cervical incompetence.  Role of HPV, cervical dysplasia and need for close followup was empasized. Informed written consent was obtained. All questions were answered. Time out performed. Urine pregnancy test was negative.  Procedure: The patient was placed in lithotomy position and the bivalved coated speculum was placed in the patient's vagina. A grounding pad placed on the patient. Lugol's solution was applied to the cervix and areas of decreased uptake were noted around the transformation zone.   Local anesthesia was administered via an intracervical block using 10 ml of 2% Lidocaine with epinephrine. The suction was turned on and the Medium 1X Fisher [employer] Biopsy Excisor on 960 Watts of blended current was used to excise the area of decreased uptake and excise the entire transformation zone. Excellent hemostasis was achieved using roller ball coagulation set at 60 Watts coagulation current. Monsel's solution was then applied and the speculum was removed from the vagina. Specimens were sent to pathology.  The patient tolerated the procedure well. Post-operative instructions given to patient, including instruction to seek medical attention for persistent bright red bleeding, fever, abdominal/pelvic pain, dysuria, nausea or vomiting. She was also told about the possibility of having copious yellow to black tinged discharge for weeks. She was counseled to avoid anything in the vagina (sex/douching/tampons) for 3 weeks. She has  a 4 week post-operative check to assess wound healing, review results and discuss further management.    Jaynie CollinsUGONNA  Adalaide Jaskolski, MD, FACOG Obstetrician & Gynecologist, [facility] for [employer], [facility]

## 2018-08-18 ENCOUNTER — Telehealth: Payer: Self-pay | Admitting: General Practice

## 2018-08-18 NOTE — Telephone Encounter (Signed)
Called patient, no answer- left message on voicemail to call us back for results. Will send mychart message.

## 2018-08-18 NOTE — Telephone Encounter (Signed)
-----   Message from Tereso NewcomerUgonna A Anyanwu, MD sent at 08/13/2018  7:00 AM EDT ----- LEEP pathology showed moderate dysplasia with unclear margins. Patient will need repeat Pap smear and ECC in 4 months. Please call to inform patient of results and recommendations, and emphasize need for close surveillance. Thank you!

## 2018-09-16 ENCOUNTER — Encounter: Payer: Self-pay | Admitting: *Deleted

## 2018-09-18 ENCOUNTER — Ambulatory Visit (INDEPENDENT_AMBULATORY_CARE_PROVIDER_SITE_OTHER): Payer: Managed Care, Other (non HMO) | Admitting: Obstetrics & Gynecology

## 2018-09-18 ENCOUNTER — Encounter: Payer: Self-pay | Admitting: Obstetrics & Gynecology

## 2018-09-18 VITALS — BP 124/73 | HR 72 | Ht 66.0 in | Wt 156.4 lb

## 2018-09-18 DIAGNOSIS — Z9889 Other specified postprocedural states: Secondary | ICD-10-CM

## 2018-09-18 DIAGNOSIS — N871 Moderate cervical dysplasia: Secondary | ICD-10-CM

## 2018-09-18 NOTE — Patient Instructions (Signed)
Return to clinic for any scheduled appointments or for any gynecologic concerns as needed.   

## 2018-09-18 NOTE — Progress Notes (Signed)
Subjective:     Barbara Mcfarland is a 27 y.o.  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 No abnormal bleeding or discharge, no intercourse yet.  The patient is not having any pain.  The following portions of the patient's history were reviewed and updated as appropriate: allergies, current medications, past family history, past medical history, past social history, past surgical history and problem list.  Review of Systems Pertinent items noted in HPI and remainder of comprehensive ROS otherwise negative.    Objective:    BP 124/73   Pulse 72   Ht 5\' 6"  (1.676 m)   Wt 156 lb 6.4 oz (70.9 kg)   BMI 25.24 kg/m  General:  alert and no distress  Abdomen: soft, bowel sounds active, non-tender  Pelvic:   LEEP site healing well, no drainage, no erythema    08/05/2018 Pathology Diagnosis Cervix, LEEP - LOW GRADE AND FOCAL HIGH GRADE SQUAMOUS INTRAEPITHELIAL LESION (CIN-I AND CIN-II). - SEE COMMENT. Microscopic Comment The specimen is entirely submitted and multiple levels were performed for microscopic evaluation. There is a small focus of high grade squamous intraepithelial lesion (CIN-II) as primarily seen in a small detached fragment in one of the blocks (1C). The latter focus shows p16 positivity. Since this fragment is detached, marginal evaluation is difficult and not accurate. This is seen in a background of low grade squamous intraepithelial lesion (CIN-I). Some of the low grade dysplastic foci show involvement of ectocervical margin. No dysplasia is seen at the endocervical margin. Clinical correlation and follow up is recommended. (BNS:ah 08/12/18)  Assessment:    Doing well postoperatively. Operative findings again reviewed. Pathology report discussed.    Plan:   1. Continue any current medications. 2. Wound care discussed. 3. Activity restrictions: none 4. Anticipated return to work: not applicable. 5. Follow up: 3 months for repeat pap and  ECC.  Jaynie CollinsUGONNA  Yasmina Chico, MD, FACOG Obstetrician & Gynecologist, [facility] for [employer], [facility]

## 2018-11-01 ENCOUNTER — Emergency Department (HOSPITAL_COMMUNITY)
Admission: EM | Admit: 2018-11-01 | Discharge: 2018-11-01 | Disposition: A | Discharge status: Home / Self Care | Payer: Managed Care, Other (non HMO) | Attending: Emergency Medicine | Admitting: Emergency Medicine

## 2018-11-01 ENCOUNTER — Encounter (HOSPITAL_COMMUNITY): Payer: Self-pay | Admitting: Emergency Medicine

## 2018-11-01 DIAGNOSIS — R51 Headache: Secondary | ICD-10-CM | POA: Insufficient documentation

## 2018-11-01 DIAGNOSIS — R519 Headache, unspecified: Secondary | ICD-10-CM

## 2018-11-01 MED ORDER — METOCLOPRAMIDE HCL 5 MG/ML IJ SOLN
10.0000 mg | Freq: Once | INTRAMUSCULAR | Status: AC
  Administered 2018-11-01: 10 mg via INTRAVENOUS
  Filled 2018-11-01: qty 2

## 2018-11-01 MED ORDER — DEXAMETHASONE SODIUM PHOSPHATE 10 MG/ML IJ SOLN
10.0000 mg | Freq: Once | INTRAMUSCULAR | Status: AC
  Administered 2018-11-01: 10 mg via INTRAVENOUS
  Filled 2018-11-01: qty 1

## 2018-11-01 MED ORDER — SODIUM CHLORIDE 0.9 % IV BOLUS
500.0000 mL | Freq: Once | INTRAVENOUS | Status: AC
  Administered 2018-11-01: 500 mL via INTRAVENOUS

## 2018-11-01 MED ORDER — ACETAMINOPHEN 500 MG PO TABS
1000.0000 mg | ORAL_TABLET | Freq: Once | ORAL | Status: AC
  Administered 2018-11-01: 1000 mg via ORAL
  Filled 2018-11-01: qty 2

## 2018-11-01 NOTE — Discharge Instructions (Signed)
You were seen in the ER for headache.   This improved with migraine medications.   Follow up with neurology for long term management of headaches.   Overuse of ibuprofen and acetaminophen can cause rebound headaches. Do not use daily or frequently.   Return for worsening headache, vision changes or loss, neck pain or stiffness, one sided weakness or numbness, stroke symptoms such as speech difficulty, gait difficulty.

## 2018-11-01 NOTE — ED Provider Notes (Signed)
[facility] EMERGENCY DEPARTMENT Provider Note   CSN: [id number] Arrival date & time: 11/01/18  1051     History   Chief Complaint Chief Complaint  Patient presents with  . Migraine    HPI Barbara Mcfarland is a 27 y.o. female with history of migraines here for evaluation of a migraine.  Onset yesterday morning, sudden, currently 10/10, sharp, nonradiating, localized to the left forehead above her left eye.  Associated with nausea and vomiting, photosensitivity and photosensitivity.  States it feels similar to previous migraine headaches, she typically does get them mostly on her left forehead.  States she has had migraines since childhood, they have been decreasing in frequency.  She gets 1-2 migraines a month.  She took ibuprofen and Tylenol last night without relief.  Headache throbs more with movement and walking.  No alleviating factors.  She is breastfeeding.   She denies fevers, neck pain, neck stiffness, changes or loss of vision, dizziness, preceding head trauma, anticoagulant use, difficulty with speech or gait, one-sided weakness or numbness.   HPI  Past Medical History:  Diagnosis Date  . ASCUS with positive high risk HPV cervical pap smear at [facility] 04/09/2018  . Migraine headache from childhood  . Raynaud phenomenon 02/20/2016    Patient Active Problem List   Diagnosis Date Noted  . 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 ASCUS with positive high risk HPV cervical pap smear at [facility] 04/09/2018    Past Surgical History:  Procedure Laterality Date  . NO PAST SURGERIES       OB History    Gravida  1   Para  1   Term  1   Preterm      AB      Living  1     SAB      TAB      Ectopic      Multiple  0   Live Births  1            Home Medications    Prior to Admission medications   Not on File    Family History Family History  Problem Relation Age of Onset  . Asthma Neg Hx   . Cancer Neg Hx   . Diabetes Neg Hx    . Hypertension Neg Hx     Social History Social History   Tobacco Use  . Smoking status: Never Smoker  . Smokeless tobacco: Never Used  Substance Use Topics  . Alcohol use: No    Alcohol/week: 0.0 standard drinks  . Drug use: No     Allergies   Patient has no known allergies.   Review of Systems Review of Systems  Eyes: Positive for photophobia.  Gastrointestinal: Positive for nausea and vomiting.  Neurological: Positive for headaches.       Phonophobia   All other systems reviewed and are negative.    Physical Exam Updated Vital Signs BP 112/79 (BP Location: Right Arm)   Pulse 73   Temp 97.9 F (36.6 C) (Oral)   Resp 18   SpO2 100%   Physical Exam  Constitutional: She appears well-developed.  Non-toxic appearance.  NAD.  HENT:  Head: Normocephalic and atraumatic.  Right Ear: External ear normal.  Left Ear: External ear normal.  Nose: Nose normal. No mucosal edema or septal deviation.  Moist mucous membranes Uvula midline Oropharynx and tonsils normal No tenderness over temporal arteries  Eyes: Conjunctivae and lids are normal.  Unable to visualize  back of eye  Neck:  No c spine spinous process or muscular tenderness  Full PROM of neck w/o rigidity  No meningeal signs   Cardiovascular: Normal rate, regular rhythm and normal heart sounds.  Pulses:      Radial pulses are 2+ on the right side, and 2+ on the left side.       Dorsalis pedis pulses are 2+ on the right side, and 2+ on the left side.  Pulmonary/Chest: Effort normal and breath sounds normal.  Lymphadenopathy:  No cervical adenopathy  Neurological: She is alert. GCS eye subscore is 4. GCS verbal subscore is 5. GCS motor subscore is 6.  Speech is fluent without obvious dysarthria or aphasia. Strength 5/5 in upper and lower extremities   Sensation to light touch intact in bilateral face, upper and lower extremities No pronator drift. No leg drop.  Normal finger-to-nose and finger tapping.    CN II-XII grossly intact bilaterally.   Skin: Skin is warm and dry. Capillary refill takes less than 2 seconds. No rash noted.  Psychiatric: She has a normal mood and affect. Her speech is normal and behavior is normal. Judgment and thought content normal.     ED Treatments / Results  Labs (all labs ordered are listed, but only abnormal results are displayed) Labs Reviewed - No data to display  EKG None  Radiology No results found.  Procedures Procedures (including critical care time)  Medications Ordered in ED Medications  sodium chloride 0.9 % bolus 500 mL (has no administration in time range)  metoCLOPramide (REGLAN) injection 10 mg (10 mg Intravenous Given 11/01/18 1247)  dexamethasone (DECADRON) injection 10 mg (10 mg Intravenous Given 11/01/18 1247)  acetaminophen (TYLENOL) tablet 1,000 mg (1,000 mg Oral Given 11/01/18 1247)     Initial Impression / Assessment and Plan / ED Course  I have reviewed the triage vital signs and the nursing notes.  Pertinent labs & imaging results that were available during my care of the patient were reviewed by me and considered in my medical decision making (see chart for details).  Clinical Course as of Nov 02 1335  Wynelle Link Nov 01, 2018  1333 Re-evaluated pt. She feels much better. HA has improved    [CG]    Clinical Course User Index [CG] Liberty Handy, PA-C   27 yo presents with HA, h/o same.  Pt reports current headache is "just like previous migraines".  Patient is without high-risk features of headache including: AMS, seizure, age > 73, history of immunocompromise, neck rigidity, fever, use of anticoagulation, trauma, h/o CVA/TIA or HTN. On exam VS are WNL, pt is well-appearing w/ no meningismus, nystagmus, focal neuro deficits, pain over temporal arteries.  Given reassuring hx and exam, emergent imaging or labs not indicated given. Low suspicion for emergent intracranial or vascular etiology.    1535: Pt given migraine cocktail  w/ patial resolution of HA. Pt adequate for DC. Discussed s/s that would warrant return to ED. Pt verbalized understanding and agreeable with ED tx and dc plan.   Final Clinical Impressions(s) / ED Diagnoses   Final diagnoses:  Recurrent headache    ED Discharge Orders    None       Liberty Handy, PA-C 11/01/18 1336    Pricilla Loveless, MD 11/01/18 1422

## 2018-11-01 NOTE — ED Triage Notes (Signed)
Pt here for migraine to left forehead, started yesterday. She has hx of migraines. This one is not being relieved with ibuprofen or tylenol.

## 2019-01-27 ENCOUNTER — Ambulatory Visit (INDEPENDENT_AMBULATORY_CARE_PROVIDER_SITE_OTHER): Payer: Managed Care, Other (non HMO) | Admitting: Obstetrics & Gynecology

## 2019-01-27 ENCOUNTER — Encounter: Payer: Self-pay | Admitting: Obstetrics & Gynecology

## 2019-01-27 VITALS — BP 117/71 | HR 60 | Ht 66.0 in | Wt 166.0 lb

## 2019-01-27 DIAGNOSIS — Z1151 Encounter for screening for human papillomavirus (HPV): Secondary | ICD-10-CM

## 2019-01-27 DIAGNOSIS — Z124 Encounter for screening for malignant neoplasm of cervix: Secondary | ICD-10-CM

## 2019-01-27 DIAGNOSIS — Z9889 Other specified postprocedural states: Secondary | ICD-10-CM | POA: Diagnosis not present

## 2019-01-27 DIAGNOSIS — N871 Moderate cervical dysplasia: Secondary | ICD-10-CM

## 2019-01-27 NOTE — Patient Instructions (Signed)
Return to clinic for any scheduled appointments or for any gynecologic concerns as needed.   

## 2019-01-27 NOTE — Progress Notes (Signed)
    GYNECOLOGY OFFICE VISIT NOTE  History:  28 y.o. G1P1001 here today for follow up pap smear after previous ASCUS +HPV pap resulted in colposcopy showing CIN III. Subsequent LEEP actually showed CIN II, but with possible positive margins. It was recommended she obtain a repeat pap smear in 4-6 months afterwards. She denies any abnormal vaginal discharge, bleeding, pelvic pain or other concerns.   Past Medical History:  Diagnosis Date  . ASCUS with positive high risk HPV cervical pap smear at [facility] 04/09/2018  . Migraine headache from childhood  . 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 Raynaud phenomenon 02/20/2016    Past Surgical History:  Procedure Laterality Date  . NO PAST SURGERIES      The following portions of the patient's history were reviewed and updated as appropriate: allergies, current medications, past family history, past medical history, past social history, past surgical history and problem list.   Review of Systems:  Pertinent items noted in HPI and remainder of comprehensive ROS otherwise negative.  Objective:  Physical Exam BP 117/71   Pulse 60   Ht 5\' 6"  (1.676 m)   Wt 166 lb (75.3 kg)   BMI 26.79 kg/m  CONSTITUTIONAL: Well-developed, well-nourished female in no acute distress.  HEENT:  Normocephalic, atraumatic. External right and left ear normal. No scleral icterus.  NECK: Normal range of motion, supple, no masses noted on observation SKIN: No rash noted. Not diaphoretic. No erythema. No pallor. MUSCULOSKELETAL: Normal range of motion. No edema noted. NEUROLOGIC: Alert and oriented to person, place, and time. Normal muscle tone coordination. No cranial nerve deficit noted. PSYCHIATRIC: Normal mood and affect. Normal behavior. Normal judgment and thought content. CARDIOVASCULAR: Normal heart rate noted RESPIRATORY: Effort and breath sounds normal, no problems with respiration noted ABDOMEN: No masses noted. No other overt distention noted.     PELVIC: Normal appearing external genitalia; normal appearing vaginal mucosa and cervix with well-healed LEEP site.  No abnormal discharge noted.  Pap smear done. Normal uterine size, no other palpable masses, no uterine or adnexal tenderness.  08/05/2018 Pathology Diagnosis Cervix, LEEP - LOW GRADE AND FOCAL HIGH GRADE SQUAMOUS INTRAEPITHELIAL LESION (CIN-I AND CIN-II). - SEE COMMENT. Microscopic Comment The specimen is entirely submitted and multiple levels were performed for microscopic evaluation. There is a small focus of high grade squamous intraepithelial lesion (CIN-II) as primarily seen in a small detached fragment in one of the blocks (1C). The latter focus shows p16 positivity. Since this fragment is detached, marginal evaluation is difficult and not accurate. This is seen in a background of low grade squamous intraepithelial lesion (CIN-I). Some of the low grade dysplastic foci show involvement of ectocervical margin. No dysplasia is seen at the endocervical margin. Clinical correlation and follow up is recommended. (BNS:ah 08/12/18)  Assessment & Plan:  1. Moderate dysplasia of cervix (CIN II) 2. S/P LEEP - Cytology - PAP done, will follow up results and manage accordingly. Discussed need for continued surveillance with patient. No other gynecologic concerns. Routine preventative health maintenance measures emphasized. Please refer to After Visit Summary for other counseling recommendations.   Return for any gynecologic concerns.   Total face-to-face time with patient: 15 minutes.  Over 50% of encounter was spent on counseling and coordination of care.   Jaynie Collins, MD, FACOG Obstetrician & Gynecologist, [facility] for [employer], [facility]

## 2019-02-01 LAB — CYTOLOGY - PAP
Diagnosis: NEGATIVE
HPV 16/18/45 genotyping: POSITIVE — AB
HPV: DETECTED — AB

## 2019-08-08 ENCOUNTER — Ambulatory Visit (INDEPENDENT_AMBULATORY_CARE_PROVIDER_SITE_OTHER)
Admission: RE | Admit: 2019-08-08 | Discharge: 2019-08-08 | Disposition: A | Discharge status: Home / Self Care | Payer: Managed Care, Other (non HMO) | Source: Ambulatory Visit

## 2019-08-08 DIAGNOSIS — N760 Acute vaginitis: Secondary | ICD-10-CM

## 2019-08-08 MED ORDER — FLUCONAZOLE 150 MG PO TABS
150.0000 mg | ORAL_TABLET | Freq: Once | ORAL | 0 refills | Status: AC
Start: 2019-08-08 — End: 2019-08-08

## 2019-08-08 NOTE — ED Provider Notes (Signed)
Virtual Visit via Video Note:  Lee-Anne Flicker  initiated request for Telemedicine visit with [facility] team. I connected with Shelda Altes  on 08/08/2019 at 12:18 PM  for a synchronized telemedicine visit using a video enabled HIPPA compliant telemedicine application. I verified that I am speaking with Shelda Altes  using two identifiers. Hallie C Wieters, PA-C  was physically located in a [facility] Urgent care site and Shauntia Levengood was located at a different location.   The limitations of evaluation and management by telemedicine as well as the availability of in-person appointments were discussed. Patient was informed that she  may incur a bill ( including co-pay) for this virtual visit encounter. Ruthell Feigenbaum  expressed understanding and gave verbal consent to proceed with virtual visit.     History of Present Illness:Barbara Mcfarland  is a 28 y.o. female presents for evaluation of possible yeast infection.has a lot of irritation and itching. She has had a whitish discharge. Sexually active but uses condoms. Has previously had yeast infections, feels similar. Also has history of BV,, but feels more similar to a yeast.  Denies any pelvic pain.  Denies abnormal bleeding.  Denies dysuria, increased frequency or urgency.  Denies fever, nausea or vomiting.  LMP- July 15-21st   No Known Allergies   Past Medical History:  Diagnosis Date  . ASCUS with positive high risk HPV cervical pap smear at [facility] 04/09/2018  . Migraine headache from childhood  . 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 Raynaud phenomenon 02/20/2016     Social History   Tobacco Use  . Smoking status: Never Smoker  . Smokeless tobacco: Never Used  Substance Use Topics  . Alcohol use: No    Alcohol/week: 0.0 standard drinks  . Drug use: No        Observations/Objective: Physical Exam  Constitutional: She is oriented to person, place, and time and well-developed, well-nourished, and in no distress. No  distress.  No acute distress  HENT:  Head: Normocephalic and atraumatic.  Neck: Normal range of motion.  Pulmonary/Chest: Effort normal. No respiratory distress.  Speaking in full sentences  Musculoskeletal:     Comments: Moving extremities appropriately  Neurological: She is alert and oriented to person, place, and time.  Speech clear, face symmetric  Nursing note and vitals reviewed.    Assessment and Plan:    ICD-10-CM   1. Vaginitis and vulvovaginitis  N76.0      Likely vaginitis, will empirically treat for yeast.  Provided 2 tablets of Diflucan.  Advised to follow-up in person if symptoms not resolving with this treatment or developing any pain.Discussed strict return precautions. Patient verbalized understanding and is agreeable with plan.   Follow Up Instructions:     I discussed the assessment and treatment plan with the patient. The patient was provided an opportunity to ask questions and all were answered. The patient agreed with the plan and demonstrated an understanding of the instructions.   The patient was advised to call back or seek an in-person evaluation if the symptoms worsen or if the condition fails to improve as anticipated.      Janith Lima, PA-C  08/08/2019 12:18 PM         Debara Pickett C, PA-C 08/08/19 1218

## 2019-08-08 NOTE — Discharge Instructions (Signed)
Diflucan 1 tab today, may repeat in 3-4 days to treat yeast infection  Follow up in person if symptoms not resolving

## 2019-08-16 ENCOUNTER — Other Ambulatory Visit: Payer: Self-pay

## 2019-08-16 ENCOUNTER — Ambulatory Visit (INDEPENDENT_AMBULATORY_CARE_PROVIDER_SITE_OTHER): Payer: Managed Care, Other (non HMO) | Admitting: General Practice

## 2019-08-16 DIAGNOSIS — B9689 Other specified bacterial agents as the cause of diseases classified elsewhere: Secondary | ICD-10-CM | POA: Diagnosis not present

## 2019-08-16 DIAGNOSIS — Z113 Encounter for screening for infections with a predominantly sexual mode of transmission: Secondary | ICD-10-CM | POA: Diagnosis not present

## 2019-08-16 DIAGNOSIS — N898 Other specified noninflammatory disorders of vagina: Secondary | ICD-10-CM

## 2019-08-16 DIAGNOSIS — N760 Acute vaginitis: Secondary | ICD-10-CM | POA: Diagnosis not present

## 2019-08-16 NOTE — Progress Notes (Signed)
Patient seen and assessed by nursing staff during this encounter. I have reviewed the chart and agree with the documentation and plan.  Verita Schneiders, MD 08/16/2019 11:58 AM

## 2019-08-16 NOTE — Progress Notes (Signed)
Patient presents to office today reporting white vaginal discharge with itching for past 1.5 weeks following intercourse. Patient states she was given Diflucan on 8/9 which she took but she still has some irritation & discharge. Patient instructed in self swab & specimen collected. Discussed results will be back in 24-48 hours and she will be contacted via mychart with results and any needed prescriptions. Patient verbalized understanding & had no questions.   Koren Bound RN BSN 08/16/19

## 2019-08-18 LAB — CERVICOVAGINAL ANCILLARY ONLY
Bacterial vaginitis: POSITIVE — AB
Candida vaginitis: NEGATIVE
Chlamydia: NEGATIVE
Neisseria Gonorrhea: NEGATIVE
Trichomonas: NEGATIVE

## 2019-08-18 MED ORDER — METRONIDAZOLE 500 MG PO TABS: 500.0000 mg | ORAL_TABLET | Freq: Two times a day (BID) | ORAL | 0 refills | Status: DC

## 2019-08-18 NOTE — Addendum Note (Signed)
Addended by: Verita Schneiders A on: 08/18/2019 03:11 PM   Modules accepted: Orders

## 2021-07-04 LAB — LAB REPORT - SCANNED: Pap: NEGATIVE

## 2021-07-24 ENCOUNTER — Emergency Department (HOSPITAL_BASED_OUTPATIENT_CLINIC_OR_DEPARTMENT_OTHER): Payer: Managed Care, Other (non HMO)

## 2021-07-24 ENCOUNTER — Emergency Department (HOSPITAL_BASED_OUTPATIENT_CLINIC_OR_DEPARTMENT_OTHER)
Admission: EM | Admit: 2021-07-24 | Discharge: 2021-07-24 | Disposition: A | Discharge status: Home / Self Care | Payer: Managed Care, Other (non HMO) | Attending: Emergency Medicine | Admitting: Emergency Medicine

## 2021-07-24 ENCOUNTER — Emergency Department (HOSPITAL_BASED_OUTPATIENT_CLINIC_OR_DEPARTMENT_OTHER): Payer: Managed Care, Other (non HMO) | Admitting: Radiology

## 2021-07-24 ENCOUNTER — Other Ambulatory Visit: Payer: Self-pay

## 2021-07-24 ENCOUNTER — Emergency Department (HOSPITAL_COMMUNITY): Payer: Managed Care, Other (non HMO)

## 2021-07-24 ENCOUNTER — Encounter (HOSPITAL_BASED_OUTPATIENT_CLINIC_OR_DEPARTMENT_OTHER): Payer: Self-pay | Admitting: *Deleted

## 2021-07-24 DIAGNOSIS — R202 Paresthesia of skin: Secondary | ICD-10-CM | POA: Diagnosis not present

## 2021-07-24 DIAGNOSIS — Y92410 Unspecified street and highway as the place of occurrence of the external cause: Secondary | ICD-10-CM | POA: Diagnosis not present

## 2021-07-24 DIAGNOSIS — M25512 Pain in left shoulder: Secondary | ICD-10-CM | POA: Diagnosis not present

## 2021-07-24 DIAGNOSIS — M25522 Pain in left elbow: Secondary | ICD-10-CM | POA: Insufficient documentation

## 2021-07-24 DIAGNOSIS — M79602 Pain in left arm: Secondary | ICD-10-CM

## 2021-07-24 DIAGNOSIS — M25532 Pain in left wrist: Secondary | ICD-10-CM | POA: Insufficient documentation
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
## 2021-07-30 ENCOUNTER — Ambulatory Visit (INDEPENDENT_AMBULATORY_CARE_PROVIDER_SITE_OTHER): Payer: Managed Care, Other (non HMO) | Admitting: Family Medicine

## 2021-07-30 ENCOUNTER — Other Ambulatory Visit: Payer: Self-pay

## 2021-07-30 VITALS — Ht 66.0 in | Wt 165.0 lb

## 2021-07-30 DIAGNOSIS — G540 Brachial plexus disorders: Secondary | ICD-10-CM | POA: Diagnosis not present

## 2021-07-30 MED ORDER — PREDNISONE 5 MG PO TABS: ORAL_TABLET | ORAL | 0 refills | Status: DC
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
## 2021-07-30 NOTE — Patient Instructions (Signed)
Nice to meet you Please continue the sling for comfort.  Please try the exercises   Please try the prednisone  Please send me a message in MyChart with any questions or updates.  Please see me back in 2 weeks.   --Dr. Jordan Likes
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
## 2021-08-13 ENCOUNTER — Ambulatory Visit: Payer: Self-pay

## 2021-08-13 ENCOUNTER — Ambulatory Visit (INDEPENDENT_AMBULATORY_CARE_PROVIDER_SITE_OTHER): Payer: Managed Care, Other (non HMO) | Admitting: Family Medicine

## 2021-08-13 ENCOUNTER — Other Ambulatory Visit: Payer: Self-pay

## 2021-08-13 VITALS — Ht 66.0 in | Wt 169.0 lb

## 2021-08-13 DIAGNOSIS — G540 Brachial plexus disorders: Secondary | ICD-10-CM

## 2021-08-13 MED ORDER — GABAPENTIN 100 MG PO CAPS: 100.0000 mg | ORAL_CAPSULE | Freq: Three times a day (TID) | ORAL | 1 refills | Status: DC

## 2021-08-13 NOTE — Patient Instructions (Signed)
Good to see you Please try physical therapy  Please start the gabapentin at night. You ca increase to 2 or 3 times daily as you tolerate.   Please send me a message in MyChart with any questions or updates.  Please see me back in 4 weeks.   --Dr. Jordan Likes

## 2021-08-13 NOTE — Assessment & Plan Note (Signed)
Ultrasound exam of the shoulder is reassuring.  Still having fatigue and weakness in the arm and feels symptoms in the fourth and fifth digit of the left hand. -Counseled on home exercise therapy and supportive care. -Gabapentin. -Provided work note. -Could consider nerve study.
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
## 2021-08-15 ENCOUNTER — Telehealth: Payer: Self-pay | Admitting: *Deleted

## 2021-08-15 NOTE — Telephone Encounter (Signed)
Patient's 07/30/21 and 08/13/21 office visit notes printed and ready for p/u with her LOA forms for [facility]. Left detailed message informing pt.

## 2021-08-18 ENCOUNTER — Encounter: Payer: Self-pay | Admitting: Radiology

## 2021-08-21 ENCOUNTER — Telehealth: Payer: Self-pay | Admitting: Radiology

## 2021-08-21 NOTE — Telephone Encounter (Signed)
Left message for patient to call to schedule New GYN appt for possible endocervical polyp at [facility]. Referral sent from [facility], referral and medical records scanned.

## 2021-08-28 ENCOUNTER — Ambulatory Visit: Payer: Managed Care, Other (non HMO) | Attending: Family Medicine | Admitting: Physical Therapy

## 2021-08-28 ENCOUNTER — Encounter: Payer: Self-pay | Admitting: Physical Therapy

## 2021-08-28 ENCOUNTER — Other Ambulatory Visit: Payer: Self-pay

## 2021-08-28 DIAGNOSIS — R208 Other disturbances of skin sensation: Secondary | ICD-10-CM | POA: Diagnosis present

## 2021-08-28 DIAGNOSIS — R29818 Other symptoms and signs involving the nervous system: Secondary | ICD-10-CM | POA: Insufficient documentation

## 2021-08-28 DIAGNOSIS — M25512 Pain in left shoulder: Secondary | ICD-10-CM | POA: Diagnosis present

## 2021-08-28 DIAGNOSIS — M6281 Muscle weakness (generalized): Secondary | ICD-10-CM | POA: Insufficient documentation

## 2021-08-28 DIAGNOSIS — R600 Localized edema: Secondary | ICD-10-CM | POA: Insufficient documentation

## 2021-08-28 DIAGNOSIS — R293 Abnormal posture: Secondary | ICD-10-CM | POA: Diagnosis present
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
## 2021-08-28 NOTE — Patient Instructions (Signed)
     Access Code: [email or web address] URL: [email or web address] Date: 08/28/2021 Prepared by: Glenetta Hew  Exercises Standing Wrist Flexion Stretch - 2-3 x daily - 7 x weekly - 3 reps - 30 sec hold Wrist Extension Stretch Pronated - 2-3 x daily - 7 x weekly - 3 reps - 30 sec hold Doorway Pec Stretch at 60 Degrees Abduction with Arm Straight - 2-3 x daily - 7 x weekly - 3 reps - 30 sec hold Doorway Pec Stretch at 90 Elevation with Arm Straight - 2-3 x daily - 7 x weekly - 3 reps - 30 sec hold Single Arm Doorway Pec Stretch at 120 Degrees Abduction - 2-3 x daily - 7 x weekly - 3 reps - 30 sec hold  Patient Education Brachial plexus nerve glide

## 2021-08-31 ENCOUNTER — Ambulatory Visit: Payer: 59 | Admitting: Rehabilitative and Restorative Service Providers"

## 2021-09-10 ENCOUNTER — Ambulatory Visit (INDEPENDENT_AMBULATORY_CARE_PROVIDER_SITE_OTHER): Payer: Managed Care, Other (non HMO) | Admitting: Family Medicine

## 2021-09-10 ENCOUNTER — Other Ambulatory Visit: Payer: Self-pay

## 2021-09-10 ENCOUNTER — Ambulatory Visit: Payer: Managed Care, Other (non HMO) | Admitting: Obstetrics and Gynecology

## 2021-09-10 ENCOUNTER — Other Ambulatory Visit (HOSPITAL_COMMUNITY)
Admission: RE | Admit: 2021-09-10 | Discharge: 2021-09-10 | Disposition: A | Discharge status: Home / Self Care | Payer: Managed Care, Other (non HMO) | Source: Ambulatory Visit | Attending: Obstetrics and Gynecology | Admitting: Obstetrics and Gynecology

## 2021-09-10 ENCOUNTER — Encounter: Payer: Self-pay | Admitting: Family Medicine

## 2021-09-10 VITALS — Ht 66.0 in | Wt 171.0 lb

## 2021-09-10 VITALS — BP 117/81 | HR 75 | Ht 66.0 in | Wt 171.6 lb

## 2021-09-10 DIAGNOSIS — G43101 Migraine with aura, not intractable, with status migrainosus: Secondary | ICD-10-CM

## 2021-09-10 DIAGNOSIS — N888 Other specified noninflammatory disorders of cervix uteri: Secondary | ICD-10-CM | POA: Diagnosis present

## 2021-09-10 DIAGNOSIS — G540 Brachial plexus disorders: Secondary | ICD-10-CM

## 2021-09-10 DIAGNOSIS — N930 Postcoital and contact bleeding: Secondary | ICD-10-CM

## 2021-09-10 DIAGNOSIS — N760 Acute vaginitis: Secondary | ICD-10-CM | POA: Insufficient documentation

## 2021-09-10 NOTE — Progress Notes (Signed)
Obstetrics and Gynecology New Patient Evaluation  Appointment Date: 09/10/2021  OBGYN Clinic: [facility]  Primary Care Provider: [facility]  Referring Provider: [facility]  Chief Complaint: ? Polyp seen at [facility] pap smear   History of Present Illness: Barbara Mcfarland is a 30 y.o. Hispanic G1P1001 (No LMP recorded. Patient has had an implant.), seen for the above chief complaint. Her past medical history is significant for h/o LEEP (07/2018) and CIN 3.  Patient had a pap at the HD on July 6 and they referred her to [place] for this. Pt states she's had post coital bleeding for a long time. She had her nexplanon placed there on 7/7 and she hasn't heard about the pap smear results.   Review of Systems: Pertinent items noted in HPI and remainder of comprehensive ROS otherwise negative.    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  Social History:  Social History   Socioeconomic History   Marital status: Single    Spouse name: Not on file   Number of children: Not on file   Years of education: Not on file   Highest education level: Not on file  Occupational History   Not on file  Tobacco Use   Smoking status: Never   Smokeless tobacco: Never  Vaping Use   Vaping Use: Never used  Substance and Sexual Activity   Alcohol  use: No    Alcohol/week: 0.0 standard drinks   Drug use: Not Currently    Types: Marijuana   Sexual activity: Yes    Birth control/protection: Implant  Other Topics Concern   Not on file  Social History Narrative   Not on file   Social Determinants of Health   Financial Resource Strain: Not on file  Food Insecurity: Not on file  Transportation Needs: Not on file  Physical Activity: Not on file  Stress: Not on file  Social Connections: Not on file  Intimate Partner Violence: Not on file    Family History:  Family History  Problem Relation Age of Onset   Asthma Neg Hx    Cancer Neg Hx    Diabetes Neg Hx    Hypertension Neg Hx      Medications Barbara Mcfarland had no medications administered during this visit. Current Outpatient Medications  Medication Sig Dispense Refill   etonogestrel (NEXPLANON) 68 MG IMPL implant 1 each by Subdermal route once.     gabapentin (NEURONTIN) 100 MG capsule Take 1 capsule (100 mg total) by mouth 3 (three) times daily. 60 capsule 1   No current facility-administered medications for this visit.    Allergies Patient has no known allergies.   Physical Exam:  BP 117/81   Pulse 75   Ht 5\' 6"  (1.676 m)   Wt 171 lb 9.6 oz (77.8 kg)   BMI 27.70 kg/m  Body mass index is 27.7 kg/m.  General appearance: Well nourished, well developed female in no acute distress.  Cardiovascular: normal s1 and s2.  No murmurs, rubs or gallops. Respiratory:  Clear to auscultation bilateral. Normal respiratory effort Abdomen: positive bowel sounds and no masses, hernias; diffusely non tender to palpation, non distended Neuro/Psych:  Normal mood and affect.  Skin:  Warm and dry.  Lymphatic:  No inguinal lymphadenopathy.   Pelvic exam: is not limited by body habitus EGBUS: within normal limits Vagina: within normal limits and with no blood or discharge in the vault Cervix: appears a little short and only to be about 2cm in length and feels about 2cm in length  on bimanual. Speculum re-inserted and  Os with friable tissue at the internal os, that comes out of the os down the posterior lip slightly. Area viewed with colposcope and it is somewhat friable but feels contiguous with the cervix and not like a polyp with a q-tip. Infection swab obtained and silver nitrate applied to the area.  Uterus:  nonenlarged and non tender Adnexa:  normal adnexa and no mass, fullness, tenderness Rectovaginal: deferred  Laboratory: per HD verbal report the pap smear was negative  Radiology: none  Assessment: pt stable  Plan:  1. Friable cervix I told her we will follow up the HD pap smear results and let her know as well as about the vaginal swab. Pelvic rest x 2 weeks and pt told to let know if still has post coital bleeding after that.  - Cervicovaginal ancillary only( [place])  RTC PRN  [place] MD Attending [facility])

## 2021-09-10 NOTE — Patient Instructions (Signed)
Good to see you Please try physical therapy at the [facility]. Location  Please call [phone] to schedule the MRI   Please send me a message in MyChart with any questions or updates.  We will setup a virtual visit once the MRI is resulted. .   --Dr. Jordan Likes

## 2021-09-10 NOTE — Assessment & Plan Note (Signed)
Has good strength and movement.  Has trigger points through the trapezius and periscapular region. -Counseled on home exercise therapy and supportive care. -Continue physical therapy. -Could consider trigger point injections.
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
## 2021-09-11 ENCOUNTER — Ambulatory Visit: Payer: 59 | Admitting: Physical Therapy

## 2021-09-11 ENCOUNTER — Encounter: Payer: Self-pay | Admitting: Family Medicine

## 2021-09-11 LAB — CERVICOVAGINAL ANCILLARY ONLY
Bacterial Vaginitis (gardnerella): POSITIVE — AB
Candida Glabrata: NEGATIVE
Candida Vaginitis: NEGATIVE
Chlamydia: NEGATIVE
Comment: NEGATIVE
Comment: NEGATIVE
Comment: NEGATIVE
Comment: NEGATIVE
Comment: NEGATIVE
Comment: NORMAL
Neisseria Gonorrhea: NEGATIVE
Trichomonas: NEGATIVE

## 2021-09-12 ENCOUNTER — Other Ambulatory Visit: Payer: Self-pay

## 2021-09-12 DIAGNOSIS — B9689 Other specified bacterial agents as the cause of diseases classified elsewhere: Secondary | ICD-10-CM

## 2021-09-12 DIAGNOSIS — N760 Acute vaginitis: Secondary | ICD-10-CM

## 2021-09-12 MED ORDER — METRONIDAZOLE 500 MG PO TABS: 500.0000 mg | ORAL_TABLET | Freq: Two times a day (BID) | ORAL | 0 refills | Status: DC

## 2021-09-12 NOTE — Progress Notes (Signed)
Flagyl sent per protocol for BV infection.

## 2021-09-17 ENCOUNTER — Other Ambulatory Visit: Payer: Self-pay

## 2021-09-17 ENCOUNTER — Ambulatory Visit: Payer: 59 | Attending: Family Medicine | Admitting: Physical Therapy

## 2021-09-17 ENCOUNTER — Encounter: Payer: Self-pay | Admitting: Physical Therapy

## 2021-09-17 DIAGNOSIS — R293 Abnormal posture: Secondary | ICD-10-CM | POA: Diagnosis present

## 2021-09-17 DIAGNOSIS — M6281 Muscle weakness (generalized): Secondary | ICD-10-CM | POA: Insufficient documentation

## 2021-09-17 DIAGNOSIS — R600 Localized edema: Secondary | ICD-10-CM | POA: Insufficient documentation

## 2021-09-17 DIAGNOSIS — R29818 Other symptoms and signs involving the nervous system: Secondary | ICD-10-CM

## 2021-09-17 DIAGNOSIS — R208 Other disturbances of skin sensation: Secondary | ICD-10-CM | POA: Diagnosis present

## 2021-09-17 DIAGNOSIS — M25512 Pain in left shoulder: Secondary | ICD-10-CM | POA: Insufficient documentation

## 2021-09-17 NOTE — Patient Instructions (Signed)
Access Code: [email or web address] URL: [email or web address] Date: 09/17/2021 Prepared by: Rosana Hoes  Exercises Standing Wrist Flexion Stretch - 2 x daily - 7 x weekly - 3 reps - 15-30 sec hold Wrist Extension Stretch Pronated - 2 x daily - 7 x weekly - 3 reps - 15-30 sec hold Doorway Pec Stretch at 60 Degrees Abduction with Arm Straight - 2 x daily - 7 x weekly - 3 reps - 15-30 sec hold Doorway Pec Stretch at 90 Elevation with Arm Straight - 2 x daily - 7 x weekly - 3 reps - 30 sec hold Single Arm Doorway Pec Stretch at 120 Degrees Abduction - 2 x daily - 7 x weekly - 3 reps - 30 sec hold Seated Cervical Sidebending Stretch - 2 x daily - 7 x weekly - 3 reps - 15-30 seconds hold Standing Upper Trapezius Mobilization with Small Ball - 2 x daily - 7 x weekly Banded Row - 2 x daily - 7 x weekly - 2 sets - 10 reps Seated Wrist Extension with Anchored Resistance - 2 x daily - 7 x weekly - 2 sets - 10 reps Seated Wrist Flexion with Anchored Resistance - 2 x daily - 7 x weekly - 2 sets - 10 reps Putty Squeezes - 2 x daily - 7 x weekly - 2 sets - 10 reps  Patient Education Brachial plexus nerve glide
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
## 2021-09-21 ENCOUNTER — Other Ambulatory Visit: Payer: Self-pay

## 2021-09-21 ENCOUNTER — Ambulatory Visit: Payer: 59 | Admitting: Physical Therapy

## 2021-09-21 ENCOUNTER — Encounter: Payer: Self-pay | Admitting: Physical Therapy

## 2021-09-21 VITALS — BP 122/95 | HR 78

## 2021-09-21 DIAGNOSIS — R293 Abnormal posture: Secondary | ICD-10-CM

## 2021-09-21 DIAGNOSIS — M25512 Pain in left shoulder: Secondary | ICD-10-CM

## 2021-09-21 DIAGNOSIS — M6281 Muscle weakness (generalized): Secondary | ICD-10-CM

## 2021-09-21 DIAGNOSIS — R29818 Other symptoms and signs involving the nervous system: Secondary | ICD-10-CM
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
## 2021-09-24 ENCOUNTER — Encounter: Payer: Self-pay | Admitting: Physical Therapy

## 2021-09-24 ENCOUNTER — Other Ambulatory Visit: Payer: Self-pay

## 2021-09-24 ENCOUNTER — Ambulatory Visit: Payer: 59 | Admitting: Physical Therapy

## 2021-09-24 DIAGNOSIS — R293 Abnormal posture: Secondary | ICD-10-CM

## 2021-09-24 DIAGNOSIS — M25512 Pain in left shoulder: Secondary | ICD-10-CM | POA: Diagnosis not present

## 2021-09-24 DIAGNOSIS — M6281 Muscle weakness (generalized): Secondary | ICD-10-CM
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
## 2021-09-27 ENCOUNTER — Ambulatory Visit
Admission: RE | Admit: 2021-09-27 | Discharge: 2021-09-27 | Disposition: A | Discharge status: Home / Self Care | Payer: Managed Care, Other (non HMO) | Source: Ambulatory Visit | Attending: Family Medicine | Admitting: Family Medicine

## 2021-09-27 ENCOUNTER — Other Ambulatory Visit: Payer: Self-pay

## 2021-09-27 DIAGNOSIS — G43101 Migraine with aura, not intractable, with status migrainosus: Secondary | ICD-10-CM

## 2021-09-27 IMAGING — MR MR HEAD W/O CM
10 series · 48 of 48 positions shown · non-contrast
Comparison: None

CLINICAL DATA: Migraine with TENA and with status migrainosus, not
intractable. Worsening migraine. Additional history provided by
scanning technologist: Patient reports history of migraines with
nausea, blurred vision with headaches, symptoms worse in the last 6
months.

EXAM:
MRI HEAD WITHOUT CONTRAST
TECHNIQUE: Multiplanar, multiecho pulse sequences of the brain and surrounding
structures were obtained without intravenous contrast.

[Series 2: T1 · sagittal · 5.0mm · 0.45mm/px · 3 of 21 slices shown]
[im 1/21]
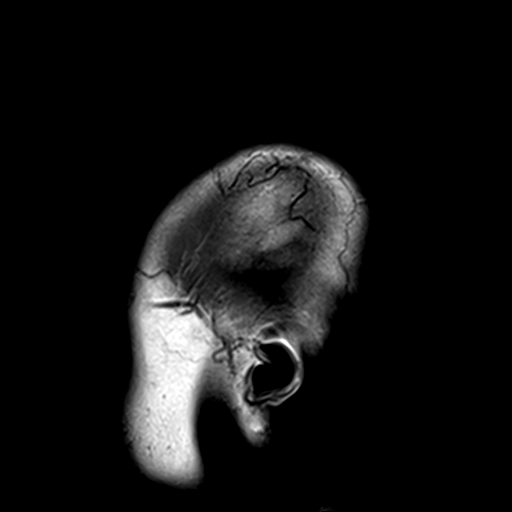
[im 11/21]
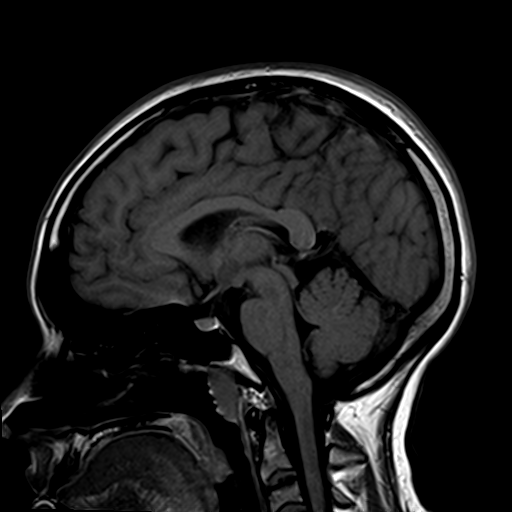
[im 21/21]
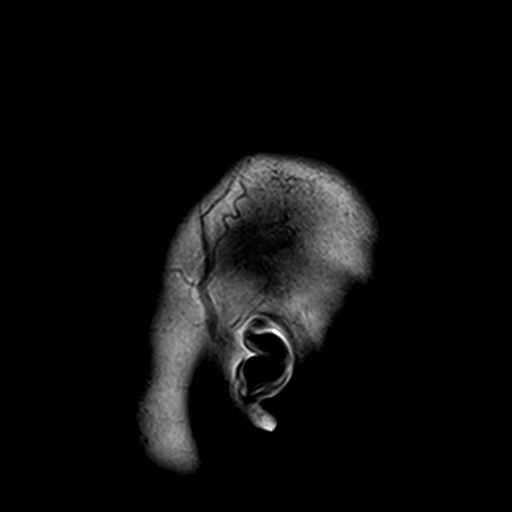

[Series 3: DWI · axial · 3.0mm · 1.80mm/px · z∈[-43,+104]mm · 9 of 100 slices shown (1 of 4)]
[im 1/100]
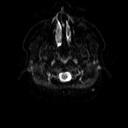
[im 13/100]
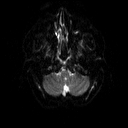
[im 25/100]
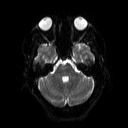
[im 38/100]
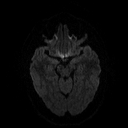
[im 50/100]
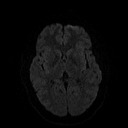
[im 62/100]
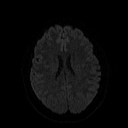
[im 75/100]
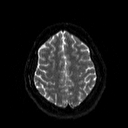
[im 87/100]
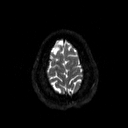
[im 100/100]
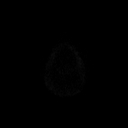

[Series 4: DWI · axial · 3.0mm · 1.80mm/px · z∈[-43,+104]mm · 4 of 50 slices shown (2 of 4)]
[im 1/50]
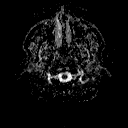
[im 17/50]
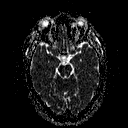
[im 33/50]
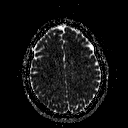
[im 50/50]
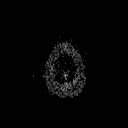

[Series 5: DWI · coronal · 5.0mm · 1.80mm/px · 6 of 72 slices shown (3 of 4)]
[im 1/72]
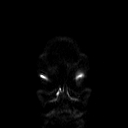
[im 15/72]
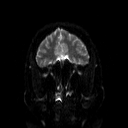
[im 29/72]
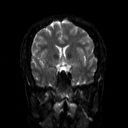
[im 43/72]
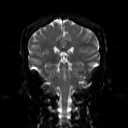
[im 57/72]
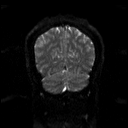
[im 72/72]
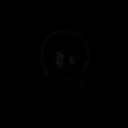

[Series 6: DWI · coronal · 5.0mm · 1.80mm/px · 3 of 36 slices shown (4 of 4)]
[im 1/36]
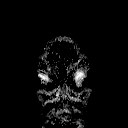
[im 18/36]
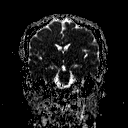
[im 36/36]
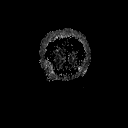

[Series 7: T2 · axial · 5.0mm · 0.51mm/px · z∈[-47,+99]mm · 2 of 22 slices shown (1 of 2)]
[im 1/22]
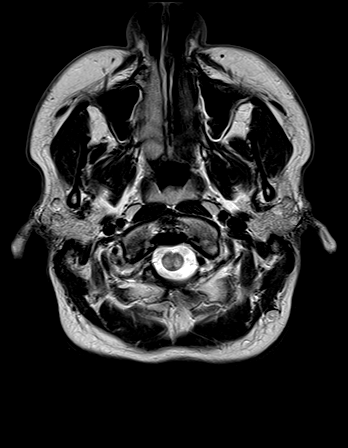
[im 22/22]
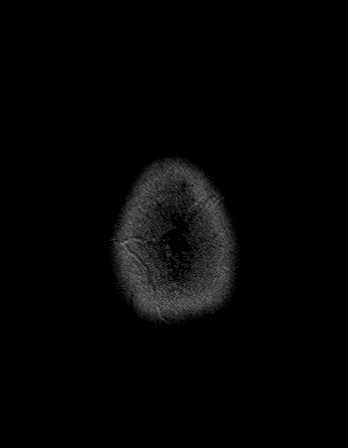

[Series 8: FLAIR · axial · 3.0mm · 0.45mm/px · z∈[-42,+92]mm · 3 of 30 slices shown]
[im 1/30]
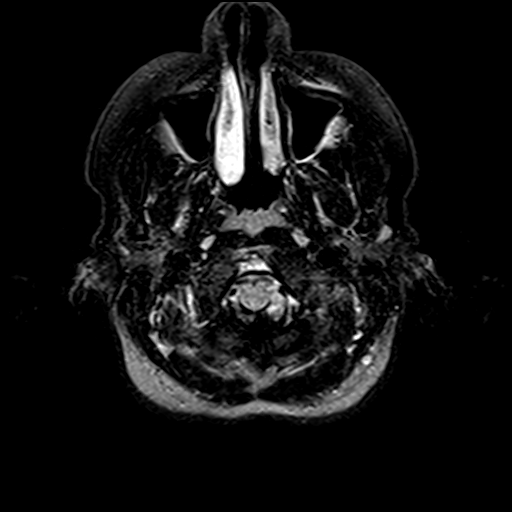
[im 15/30]
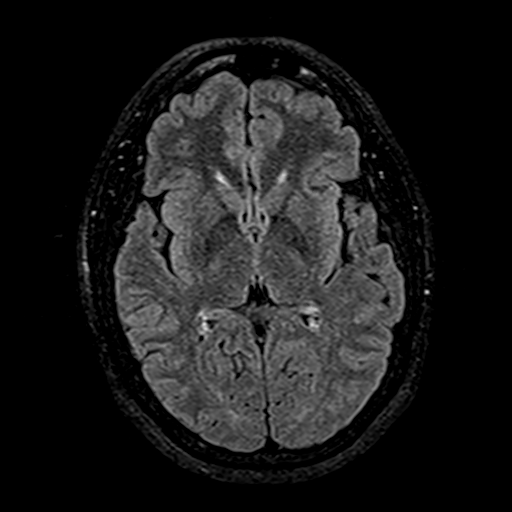
[im 30/30]
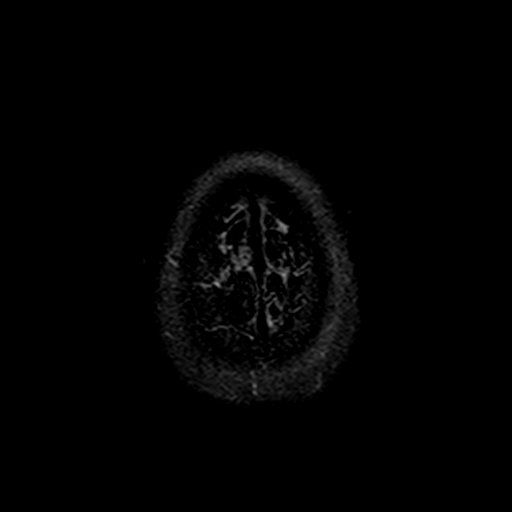

[Series 10: swi_images · axial · 4.0mm · 0.90mm/px · z∈[-45,+95]mm · 3 of 36 slices shown]
[im 1/36]
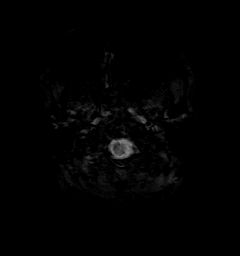
[im 18/36]
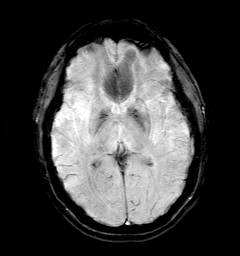
[im 36/36]
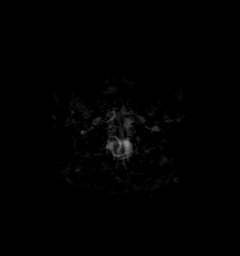

[Series 11: t1_mpr_tra · axial · 1.0mm · 0.71mm/px · z∈[-45,+97]mm · 13 of 144 slices shown]
[im 1/144]
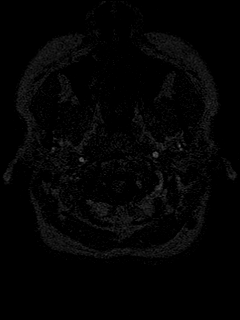
[im 12/144]
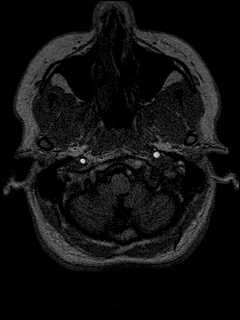
[im 24/144]
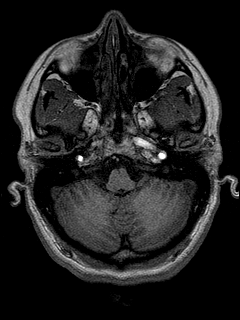
[im 36/144]
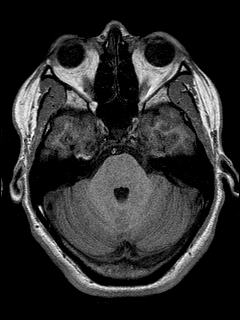
[im 48/144]
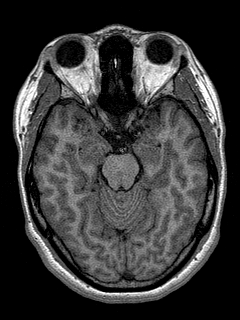
[im 60/144]
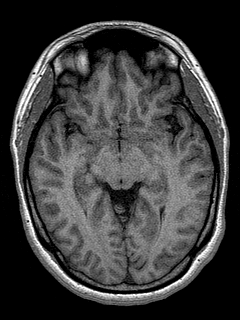
[im 72/144]
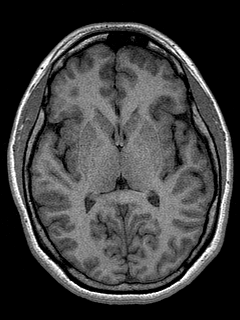
[im 84/144]
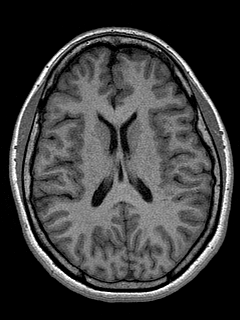
[im 96/144]
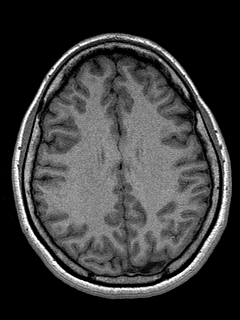
[im 108/144]
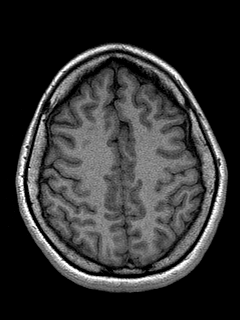
[im 120/144]
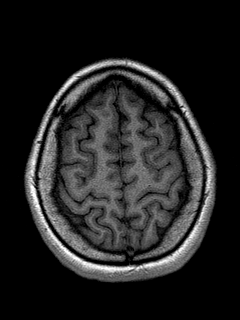
[im 132/144]
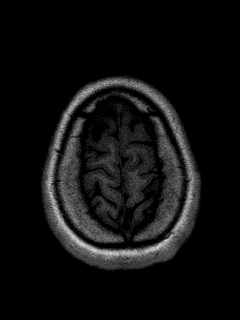
[im 144/144]
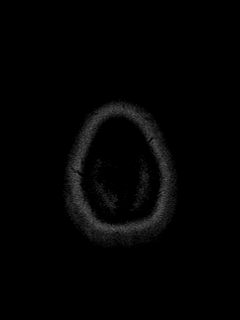

[Series 12: T2 · coronal · 5.0mm · 0.45mm/px · 2 of 27 slices shown (2 of 2)]
[im 1/27]
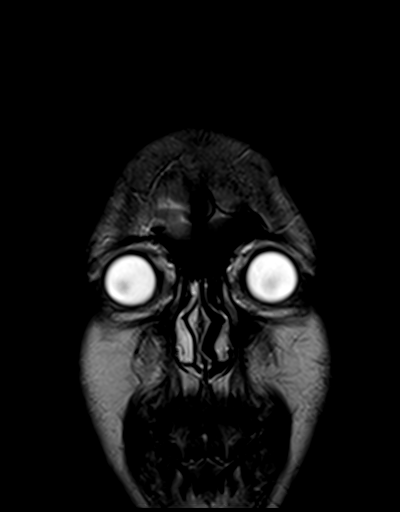
[im 27/27]
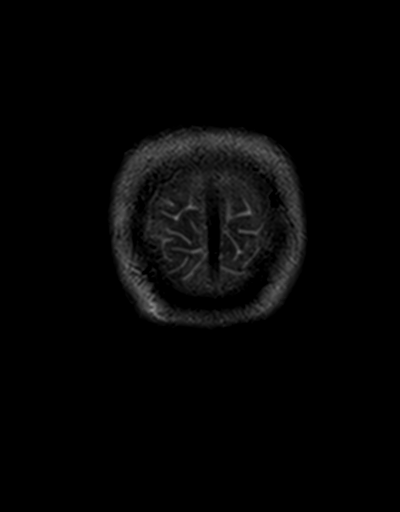

[48 of 48 positions shown; findings below may reference images not displayed]

FINDINGS: Brain:

Cerebral volume is normal.

No cortical encephalomalacia is identified. No significant cerebral
white matter disease.

There is no acute infarct.

No evidence of an intracranial mass.

No chronic intracranial blood products.

No extra-axial fluid collection.

No midline shift.

Vascular: Maintained flow voids within the proximal large arterial
vessels.

Skull and upper cervical spine: No focal suspicious marrow lesion.

Sinuses/Orbits: Visualized orbits show no acute finding. Trace
mucosal thickening within the bilateral ethmoid, sphenoid and
maxillary sinuses.

Other: Nonspecific prominence of the nasopharyngeal/adenoid soft
tissues.
IMPRESSION: Unremarkable non-contrast MRI appearance of the brain. No evidence
of acute intracranial abnormality.

Nonspecific prominence of the nasopharyngeal/adenoid soft tissues.

## 2021-09-28 ENCOUNTER — Encounter: Payer: Self-pay | Admitting: Physical Therapy

## 2021-09-28 ENCOUNTER — Ambulatory Visit: Payer: 59 | Admitting: Physical Therapy

## 2021-09-28 ENCOUNTER — Other Ambulatory Visit: Payer: Self-pay

## 2021-09-28 DIAGNOSIS — M25512 Pain in left shoulder: Secondary | ICD-10-CM | POA: Diagnosis not present

## 2021-09-28 DIAGNOSIS — R600 Localized edema: Secondary | ICD-10-CM

## 2021-09-28 DIAGNOSIS — R293 Abnormal posture: Secondary | ICD-10-CM

## 2021-09-28 DIAGNOSIS — R208 Other disturbances of skin sensation: Secondary | ICD-10-CM

## 2021-09-28 DIAGNOSIS — R29818 Other symptoms and signs involving the nervous system: Secondary | ICD-10-CM

## 2021-09-28 DIAGNOSIS — M6281 Muscle weakness (generalized): Secondary | ICD-10-CM
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
## 2021-09-28 NOTE — Patient Instructions (Signed)
Access Code: [email or web address] URL: [email or web address] Date: 09/28/2021 Prepared by: Rosana Hoes  Exercises Standing Wrist Flexion Stretch - 2 x daily - 7 x weekly - 3 reps - 15-30 sec hold Wrist Extension Stretch Pronated - 2 x daily - 7 x weekly - 3 reps - 15-30 sec hold Doorway Pec Stretch at 60 Degrees Abduction with Arm Straight - 2 x daily - 7 x weekly - 3 reps - 15-30 sec hold Doorway Pec Stretch at 90 Elevation with Arm Straight - 2 x daily - 7 x weekly - 3 reps - 30 sec hold Single Arm Doorway Pec Stretch at 120 Degrees Abduction - 2 x daily - 7 x weekly - 3 reps - 30 sec hold Seated Cervical Sidebending Stretch - 2 x daily - 7 x weekly - 3 reps - 15-30 seconds hold Standing Upper Trapezius Mobilization with Small Ball - 2 x daily - 7 x weekly Banded Row - 1 x daily - 7 x weekly - 2 sets - 10 reps Seated Wrist Extension with Anchored Resistance - 1 x daily - 7 x weekly - 2 sets - 10 reps Seated Wrist Flexion with Anchored Resistance - 1 x daily - 7 x weekly - 2 sets - 10 reps Forearm Pronation and Supination with Hammer - 1 x daily - 7 x weekly - 2 sets - 10 reps Standing Wrist Radial Deviation with Hammer - 1 x daily - 7 x weekly - 2 sets - 10 reps Standing Wrist Ulnar Deviation with Hammer - 1 x daily - 7 x weekly - 2 sets - 10 reps Putty Squeezes - 1 x daily - 7 x weekly - 2 sets - 10 reps Key Pinch with Putty - 1 x daily - 7 x weekly - 2 sets - 10 reps Rubber Band Spread - 1 x daily - 7 x weekly - 2 sets - 10 reps Farmer's Carry with Kettlebells - 1 x daily - 7 x weekly - 3 sets - 20 hold  Patient Education Brachial plexus nerve glide

## 2021-10-01 ENCOUNTER — Ambulatory Visit: Payer: 59 | Attending: Family Medicine | Admitting: Physical Therapy

## 2021-10-01 ENCOUNTER — Encounter: Payer: Self-pay | Admitting: Physical Therapy

## 2021-10-01 ENCOUNTER — Other Ambulatory Visit: Payer: Self-pay

## 2021-10-01 DIAGNOSIS — R293 Abnormal posture: Secondary | ICD-10-CM | POA: Diagnosis present

## 2021-10-01 DIAGNOSIS — M6281 Muscle weakness (generalized): Secondary | ICD-10-CM

## 2021-10-01 DIAGNOSIS — M25512 Pain in left shoulder: Secondary | ICD-10-CM | POA: Insufficient documentation
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
## 2021-10-02 ENCOUNTER — Encounter: Payer: Managed Care, Other (non HMO) | Admitting: Physical Therapy

## 2021-10-04 ENCOUNTER — Telehealth (INDEPENDENT_AMBULATORY_CARE_PROVIDER_SITE_OTHER): Payer: 59 | Admitting: Family Medicine

## 2021-10-04 ENCOUNTER — Encounter: Payer: Self-pay | Admitting: Family Medicine

## 2021-10-04 DIAGNOSIS — G43101 Migraine with aura, not intractable, with status migrainosus: Secondary | ICD-10-CM | POA: Diagnosis not present

## 2021-10-04 NOTE — Assessment & Plan Note (Signed)
No structural changes appreciated on MRI.  Still continues to have migraines on a regular basis. -Counseled on supportive care. -Could consider referral to neurology.

## 2021-10-04 NOTE — Progress Notes (Signed)
Virtual Visit via Video Note  I connected with [name] on 10/04/21 at  8:00 AM EDT by a video enabled telemedicine application and verified that I am speaking with the correct person using two identifiers.  Location: Patient: vehicle Provider: office   I discussed the limitations of evaluation and management by telemedicine and the availability of in person appointments. The patient expressed understanding and agreed to proceed.  History of Present Illness:  Ms. Barbara Mcfarland is a 30 year old female is following up after the MRI of her brain.  This showed no intracranial process.  She is continuing to have migraines on a consistent basis.   Observations/Objective:   Assessment and Plan:  Migraine:  No structural changes appreciated on MRI.  Still continues to have migraines on a regular basis. -Counseled on supportive care. -Could consider referral to neurology.  Follow Up Instructions:    I discussed the assessment and treatment plan with the patient. The patient was provided an opportunity to ask questions and all were answered. The patient agreed with the plan and demonstrated an understanding of the instructions.   The patient was advised to call back or seek an in-person evaluation if the symptoms worsen or if the condition fails to improve as anticipated.    Clare Gandy, MD

## 2021-10-05 ENCOUNTER — Ambulatory Visit: Payer: 59 | Admitting: Physical Therapy

## 2021-10-08 ENCOUNTER — Encounter: Payer: Self-pay | Admitting: Physical Therapy

## 2021-10-08 ENCOUNTER — Other Ambulatory Visit: Payer: Self-pay

## 2021-10-08 ENCOUNTER — Ambulatory Visit: Payer: 59 | Admitting: Physical Therapy

## 2021-10-08 DIAGNOSIS — M25512 Pain in left shoulder: Secondary | ICD-10-CM | POA: Diagnosis not present

## 2021-10-08 DIAGNOSIS — R293 Abnormal posture: Secondary | ICD-10-CM

## 2021-10-08 DIAGNOSIS — M6281 Muscle weakness (generalized): Secondary | ICD-10-CM
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
## 2021-10-12 ENCOUNTER — Encounter: Payer: Managed Care, Other (non HMO) | Admitting: Physical Therapy

## 2021-10-15 ENCOUNTER — Ambulatory Visit: Payer: Self-pay | Admitting: Physical Therapy

## 2021-10-18 ENCOUNTER — Ambulatory Visit (INDEPENDENT_AMBULATORY_CARE_PROVIDER_SITE_OTHER): Payer: 59 | Admitting: Family Medicine

## 2021-10-18 DIAGNOSIS — G540 Brachial plexus disorders: Secondary | ICD-10-CM | POA: Diagnosis not present

## 2021-10-18 NOTE — Progress Notes (Signed)
  Barbara Mcfarland - 30 y.o. female MRN [id number]  Date of birth: 1991/04/15  SUBJECTIVE:  Including CC & ROS.  No chief complaint on file.   Barbara Mcfarland is a 30 y.o. female that is following up for her left arm/shoulder pain.    Review of Systems See HPI   HISTORY: Past Medical, Surgical, Social, and Family History Reviewed & Updated per EMR.   Pertinent Historical Findings include:  Past Medical History:  Diagnosis Date   ASCUS with positive high risk HPV cervical pap smear at [facility] 04/09/2018   Migraine headache from childhood   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   Raynaud phenomenon 02/20/2016    Past Surgical History:  Procedure Laterality Date   NO PAST SURGERIES      Family History  Problem Relation Age of Onset   Asthma Neg Hx    Cancer Neg Hx    Diabetes Neg Hx    Hypertension Neg Hx     Social History   Socioeconomic History   Marital status: Single    Spouse name: Not on file   Number of children: Not on file   Years of education: Not on file   Highest education level: Not on file  Occupational History   Not on file  Tobacco Use   Smoking status: Never   Smokeless tobacco: Never  Vaping Use   Vaping Use: Never used  Substance and Sexual Activity   Alcohol use: No    Alcohol/week: 0.0 standard drinks   Drug use: Not Currently    Types: Marijuana   Sexual activity: Yes    Birth control/protection: Implant  Other Topics Concern   Not on file  Social History Narrative   Not on file   Social Determinants of Health   Financial Resource Strain: Not on file  Food Insecurity: Not on file  Transportation Needs: Not on file  Physical Activity: Not on file  Stress: Not on file  Social Connections: Not on file  Intimate Partner Violence: Not on file     PHYSICAL EXAM:  VS: BP 120/70   Ht 5\' 6"  (1.676 m)   Wt 176 lb (79.8 kg)   BMI 28.41 kg/m  Physical Exam Gen: NAD, alert, cooperative with exam, well-appearing    ASSESSMENT &  PLAN:   Brachial plexopathy Feeling better but having limitations in her lifting  - counseled on home exercise therapy and supportive care - provided work note.

## 2021-10-18 NOTE — Patient Instructions (Signed)
Good to see you  Please send me a message in MyChart with any questions or updates.  Please see me back in 6 weeks.   --Dr. Jordan Likes

## 2021-10-18 NOTE — Assessment & Plan Note (Signed)
Feeling better but having limitations in her lifting  - counseled on home exercise therapy and supportive care - provided work note.

## 2021-10-22 ENCOUNTER — Encounter: Payer: Self-pay | Admitting: Physical Therapy

## 2021-11-29 ENCOUNTER — Encounter: Payer: Self-pay | Admitting: Family Medicine

## 2021-11-29 ENCOUNTER — Ambulatory Visit (INDEPENDENT_AMBULATORY_CARE_PROVIDER_SITE_OTHER): Payer: 59 | Admitting: Family Medicine

## 2021-11-29 VITALS — BP 100/62 | Ht 66.0 in | Wt 180.0 lb

## 2021-11-29 DIAGNOSIS — G540 Brachial plexus disorders: Secondary | ICD-10-CM | POA: Diagnosis not present
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
## 2021-11-29 NOTE — Patient Instructions (Signed)
Good to see you We'll send you for a nerve study  We'll get the MRI to evaluate the brachial plexus   Please send me a message in MyChart with any questions or updates.  We'll setup a virtual visit once the MRI is resulted.   --Dr. Jordan Likes
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
## 2022-01-26 NOTE — Progress Notes (Signed)
Subjective:    Barbara Mcfarland - 31 y.o. female MRN [id number]  Date of birth: 18-Dec-1991  HPI  Aeriana Mcfarland is to establish care.   Current issues and/or concerns: DIZZINESS: Began suddenly 6 months ago. Happens intermittently last time being 4 weeks ago. Endorses room spinning, light-headedness, nausea, fatigue, pressure in chest, and feeling drained.  Denies headaches. Thinks may be side effect of eating tortillas. Noticed symptoms usually occur after eating the same. Since 4 weeks ago has not eaten tortillas and no dizziness. Also, considered if related to low blood sugars or blood pressure. Sister has similar symptoms for example when driving. Patient reports she is not breastfeeding.  2. COUGH: Works in a warehouse doing a physically demanding job. Sweating a lot and then going out into cold or rainy weather makes sick. She is wearing a coat outside. Endorses cough and throat irritation. Wonders if she has an infection that requires antibiotics.   3. NOSE BURNING: Left nostril. Around August 2022 had MRI which showed abnormality of left nasal area. Unsure of exact diagnosis and recommendation at that time. Not ready for referral to ENT as of present. Will update provider if decides to do so in the future.   Depression screen [facility] 2/9 01/30/2022 01/27/2019 09/18/2018 08/05/2018 02/20/2016  Decreased Interest 0 1 0 0 0  Down, Depressed, Hopeless 0 0 0 0 1  PHQ - 2 Score 0 1 0 0 1  Altered sleeping - 1 0 0 1  Tired, decreased energy - 1 0 1 2  Change in appetite - 0 0 0 0  Feeling bad or failure about yourself  - 0 0 0 0  Trouble concentrating - 0 0 0 0  Moving slowly or fidgety/restless - 0 0 0 0  Suicidal thoughts - - 0 0 0  PHQ-9 Score - 3 0 1 4  Difficult doing work/chores - - - - Somewhat difficult      ROS per HPI    Health Maintenance:  Health Maintenance Due  Topic Date Due   COVID-19 Vaccine (1) Never done   Hepatitis C Screening  Never done   INFLUENZA VACCINE  Never done    PAP-Cervical Cytology Screening  01/27/2022    Past Medical History: Patient Active Problem List   Diagnosis Date Noted   PCB (post coital bleeding) 09/10/2021   Friable cervix 09/10/2021   Brachial plexopathy 07/30/2021   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 She has never used smokeless tobacco. She reports that she does not currently use drugs after having used the following drugs: Marijuana. She reports that she does not drink alcohol.   Family History  family history is not on file.   Medications: reviewed and updated   Objective:   Physical Exam BP 111/77 (BP Location: Left Arm, Patient Position: Sitting, Cuff Size: Large)    Pulse 60    Temp 98.7 F (37.1 C)    Resp 18    Ht 5' 5.98" (1.676 m)    Wt 177 lb 3.2 oz (80.4 kg)    SpO2 98%    BMI 28.61 kg/m   Physical Exam HENT:     Head: Normocephalic and atraumatic.     Nose: Nose normal.     Mouth/Throat:  Mouth: Mucous membranes are moist.     Pharynx: Oropharynx is clear.  Eyes:     Extraocular Movements: Extraocular movements intact.     Conjunctiva/sclera: Conjunctivae normal.     Pupils: Pupils are equal, round, and reactive to light.  Cardiovascular:     Rate and Rhythm: Normal rate and regular rhythm.     Pulses: Normal pulses.     Heart sounds: Normal heart sounds.  Pulmonary:     Effort: Pulmonary effort is normal.     Breath sounds: Normal breath sounds.  Musculoskeletal:     Cervical back: Normal range of motion and neck supple.  Neurological:     General: No focal deficit present.     Mental Status: She is alert and oriented to person, place, and time.  Psychiatric:        Mood and Affect: Mood normal.        Behavior: Behavior normal.   Results for orders placed or performed  in visit on 01/30/22  Rapid Strep A  Result Value Ref Range   Rapid Strep A Screen Negative Negative       Assessment & Plan:  1. Encounter to establish care: - Patient presents today to establish care.  - Return for annual physical examination, labs, and health maintenance. Arrive fasting meaning having no food for at least 8 hours prior to appointment. You may have only water or black coffee. Please take scheduled medications as normal.  2. Vertigo: - Meclizine as prescribed. Counseled on medication compliance and adverse effects. - Follow-up with primary provider in 4 weeks or sooner if needed.  - meclizine (ANTIVERT) 12.5 MG tablet; Take 1 tablet (12.5 mg total) by mouth 3 (three) times daily as needed for dizziness.  Dispense: 30 tablet; Refill: 0  3. Other fatigue: - TSH to check thyroid function.  - CBC to screen for anemia. - Vitamin D screening for deficiency.  - TSH - Vitamin D, 25-hydroxy - CBC  4. Acute cough: 5. Throat irritation: - Rapid Strep negative. Sending culture for further evaluation. - Respiratory panel for further evaluation  - Benzonatate capsules as prescribed for cough.  - Maintain adequate hydration, may use salt water gargles or warm broth as needed, diet of smooth/slippery/wet foods such as Jell-O or ice cream, hard sucking candies such as butterscotch, and soothing throat lozenges purchased over-the-counter . - Follow-up with primary provider as scheduled.  - Rapid Strep A - Culture, Group A Strep - COVID-19, Flu A+B and RSV - benzonatate (TESSALON) 100 MG capsule; Take 1 capsule (100 mg total) by mouth 3 (three) times daily as needed for cough.  Dispense: 20 capsule; Refill: 0  6. Nasal burning: - Patient declined referral to ENT as of present. Follow-up with primary provider as scheduled.      Patient was given clear instructions to go to Emergency Department or return to medical center if symptoms don't improve, worsen, or new problems  develop.The patient verbalized understanding.  I discussed the assessment and treatment plan with the patient. The patient was provided an opportunity to ask questions and all were answered. The patient agreed with the plan and demonstrated an understanding of the instructions.   The patient was advised to call back or seek an in-person evaluation if the symptoms worsen or if the condition fails to improve as anticipated.    Durene Fruits, NP 01/30/2022, 11:12 AM Primary Care at [facility]

## 2022-01-30 ENCOUNTER — Encounter: Payer: Self-pay | Admitting: Family

## 2022-01-30 ENCOUNTER — Ambulatory Visit (INDEPENDENT_AMBULATORY_CARE_PROVIDER_SITE_OTHER): Payer: 59 | Admitting: Family

## 2022-01-30 ENCOUNTER — Other Ambulatory Visit: Payer: Self-pay

## 2022-01-30 VITALS — BP 111/77 | HR 60 | Temp 98.7°F | Resp 18 | Ht 65.98 in | Wt 177.2 lb

## 2022-01-30 DIAGNOSIS — R051 Acute cough: Secondary | ICD-10-CM

## 2022-01-30 DIAGNOSIS — J392 Other diseases of pharynx: Secondary | ICD-10-CM | POA: Diagnosis not present

## 2022-01-30 DIAGNOSIS — R42 Dizziness and giddiness: Secondary | ICD-10-CM

## 2022-01-30 DIAGNOSIS — Z7689 Persons encountering health services in other specified circumstances: Secondary | ICD-10-CM | POA: Diagnosis not present

## 2022-01-30 DIAGNOSIS — R5383 Other fatigue: Secondary | ICD-10-CM | POA: Diagnosis not present

## 2022-01-30 DIAGNOSIS — R208 Other disturbances of skin sensation: Secondary | ICD-10-CM

## 2022-01-30 LAB — POCT RAPID STREP A (OFFICE): Rapid Strep A Screen: NEGATIVE

## 2022-01-30 MED ORDER — MECLIZINE HCL 12.5 MG PO TABS: 12.5000 mg | ORAL_TABLET | Freq: Three times a day (TID) | ORAL | 0 refills | Status: DC | PRN

## 2022-01-30 MED ORDER — BENZONATATE 100 MG PO CAPS: 100.0000 mg | ORAL_CAPSULE | Freq: Three times a day (TID) | ORAL | 0 refills | Status: DC | PRN

## 2022-01-30 NOTE — Progress Notes (Signed)
Pt presents to establish care, has complaints of lightheadedness and dizziness, fatigue, along w/nausea going on approx 6 months

## 2022-01-30 NOTE — Progress Notes (Signed)
Rapid Strep: negative

## 2022-01-31 ENCOUNTER — Encounter: Payer: Self-pay | Admitting: Family

## 2022-01-31 ENCOUNTER — Other Ambulatory Visit: Payer: Self-pay | Admitting: Family

## 2022-01-31 DIAGNOSIS — E559 Vitamin D deficiency, unspecified: Secondary | ICD-10-CM | POA: Insufficient documentation

## 2022-01-31 LAB — COVID-19, FLU A+B AND RSV
Influenza A, NAA: NOT DETECTED
Influenza B, NAA: NOT DETECTED
RSV, NAA: NOT DETECTED
SARS-CoV-2, NAA: NOT DETECTED

## 2022-01-31 LAB — CBC
Hematocrit: 43.7 % (ref 34.0–46.6)
Hemoglobin: 15.5 g/dL (ref 11.1–15.9)
MCH: 31.9 pg (ref 26.6–33.0)
MCHC: 35.5 g/dL (ref 31.5–35.7)
MCV: 90 fL (ref 79–97)
Platelets: 212 10*3/uL (ref 150–450)
RBC: 4.86 x10E6/uL (ref 3.77–5.28)
RDW: 11.8 % (ref 11.7–15.4)
WBC: 5.3 10*3/uL (ref 3.4–10.8)

## 2022-01-31 LAB — TSH: TSH: 1.2 u[IU]/mL (ref 0.450–4.500)

## 2022-01-31 LAB — VITAMIN D 25 HYDROXY (VIT D DEFICIENCY, FRACTURES): Vit D, 25-Hydroxy: 19.8 ng/mL — ABNORMAL LOW (ref 30.0–100.0)

## 2022-01-31 MED ORDER — VITAMIN D (ERGOCALCIFEROL) 1.25 MG (50000 UNIT) PO CAPS: 50000.0000 [IU] | ORAL_CAPSULE | ORAL | 3 refills | Status: DC

## 2022-01-31 NOTE — Progress Notes (Signed)
Covid, Flu, RSV negative.

## 2022-01-31 NOTE — Progress Notes (Signed)
Thyroid function normal.   No anemia.   Vitamin D level low. A prescription for Ergocalciferol was sent to your pharmacy. Encouraged to recheck vitamin D levels in 12 weeks.

## 2022-02-01 ENCOUNTER — Ambulatory Visit (INDEPENDENT_AMBULATORY_CARE_PROVIDER_SITE_OTHER): Payer: Managed Care, Other (non HMO) | Admitting: Diagnostic Neuroimaging

## 2022-02-01 ENCOUNTER — Encounter: Payer: Self-pay | Admitting: Diagnostic Neuroimaging

## 2022-02-01 VITALS — BP 114/81 | HR 87 | Ht 66.0 in | Wt 177.0 lb

## 2022-02-01 DIAGNOSIS — M79602 Pain in left arm: Secondary | ICD-10-CM

## 2022-02-01 NOTE — Patient Instructions (Signed)
POST-TRAUMATIC LEFT ARM PAIN / NUMBNESS (since Aug 2022) - check EMG/NCS - continue exercises - follow up MRI brachial plexus
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
## 2022-02-02 LAB — CULTURE, GROUP A STREP: Strep A Culture: NEGATIVE

## 2022-02-02 NOTE — Progress Notes (Signed)
Strep A Culture negative.

## 2022-02-04 ENCOUNTER — Other Ambulatory Visit: Payer: Self-pay

## 2022-02-04 ENCOUNTER — Encounter (HOSPITAL_COMMUNITY): Payer: Self-pay

## 2022-02-04 ENCOUNTER — Emergency Department (HOSPITAL_COMMUNITY)
Admission: EM | Admit: 2022-02-04 | Discharge: 2022-02-04 | Disposition: A | Discharge status: Home / Self Care | Payer: Managed Care, Other (non HMO) | Attending: Emergency Medicine | Admitting: Emergency Medicine

## 2022-02-04 ENCOUNTER — Emergency Department (HOSPITAL_COMMUNITY): Payer: Managed Care, Other (non HMO)

## 2022-02-04 DIAGNOSIS — R0602 Shortness of breath: Secondary | ICD-10-CM | POA: Insufficient documentation

## 2022-02-04 DIAGNOSIS — Z20822 Contact with and (suspected) exposure to covid-19: Secondary | ICD-10-CM | POA: Diagnosis not present

## 2022-02-04 DIAGNOSIS — R079 Chest pain, unspecified: Secondary | ICD-10-CM

## 2022-02-04 DIAGNOSIS — R0789 Other chest pain: Secondary | ICD-10-CM | POA: Diagnosis not present

## 2022-02-04 LAB — CBC
HCT: 44.4 % (ref 36.0–46.0)
Hemoglobin: 15.4 g/dL — ABNORMAL HIGH (ref 12.0–15.0)
MCH: 30.7 pg (ref 26.0–34.0)
MCHC: 34.7 g/dL (ref 30.0–36.0)
MCV: 88.6 fL (ref 80.0–100.0)
Platelets: 234 10*3/uL (ref 150–400)
RBC: 5.01 MIL/uL (ref 3.87–5.11)
RDW: 11.8 % (ref 11.5–15.5)
WBC: 6.1 10*3/uL (ref 4.0–10.5)
nRBC: 0 % (ref 0.0–0.2)

## 2022-02-04 LAB — BASIC METABOLIC PANEL
Anion gap: 9 (ref 5–15)
BUN: 10 mg/dL (ref 6–20)
CO2: 23 mmol/L (ref 22–32)
Calcium: 9.1 mg/dL (ref 8.9–10.3)
Chloride: 105 mmol/L (ref 98–111)
Creatinine, Ser: 0.75 mg/dL (ref 0.44–1.00)
GFR, Estimated: >60 mL/min (ref >60)
Glucose, Bld: 94 mg/dL (ref 70–99)
Potassium: 3.6 mmol/L (ref 3.5–5.1)
Sodium: 137 mmol/L (ref 135–145)

## 2022-02-04 LAB — I-STAT BETA HCG BLOOD, ED (MC, WL, AP ONLY): I-stat hCG, quantitative: <5 m[IU]/mL (ref <5)

## 2022-02-04 LAB — RESP PANEL BY RT-PCR (FLU A&B, COVID) ARPGX2
Influenza A by PCR: NEGATIVE
Influenza B by PCR: NEGATIVE
SARS Coronavirus 2 by RT PCR: NEGATIVE

## 2022-02-04 LAB — TROPONIN I (HIGH SENSITIVITY): Troponin I (High Sensitivity): <2 ng/L (ref <18)

## 2022-02-04 IMAGING — DX DG CHEST 2V
2 series · 2 of 2 positions shown · non-contrast
Comparison: None.

CLINICAL DATA: Chest pain.

EXAM:
CHEST - 2 VIEW

[chest pa]
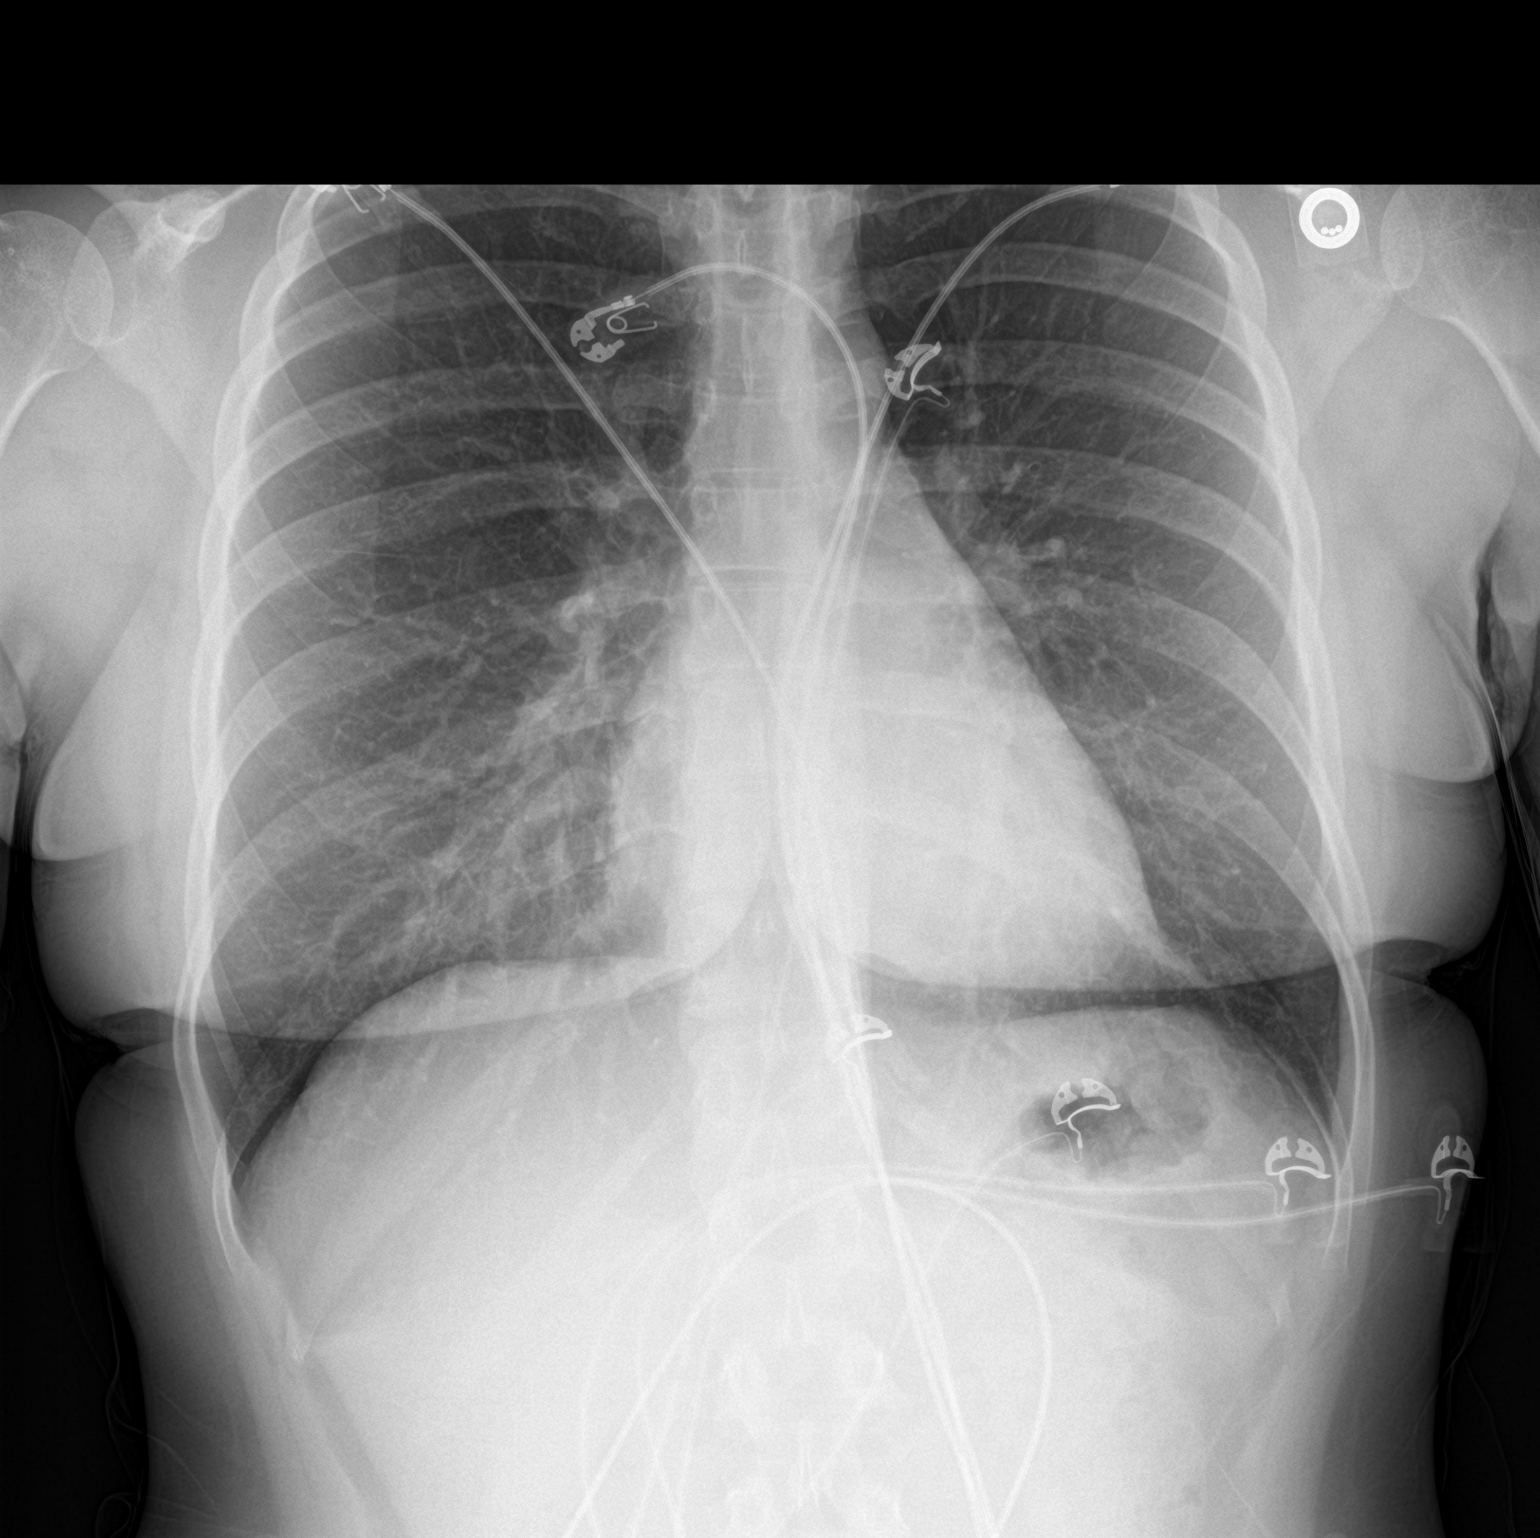

[chest lat]
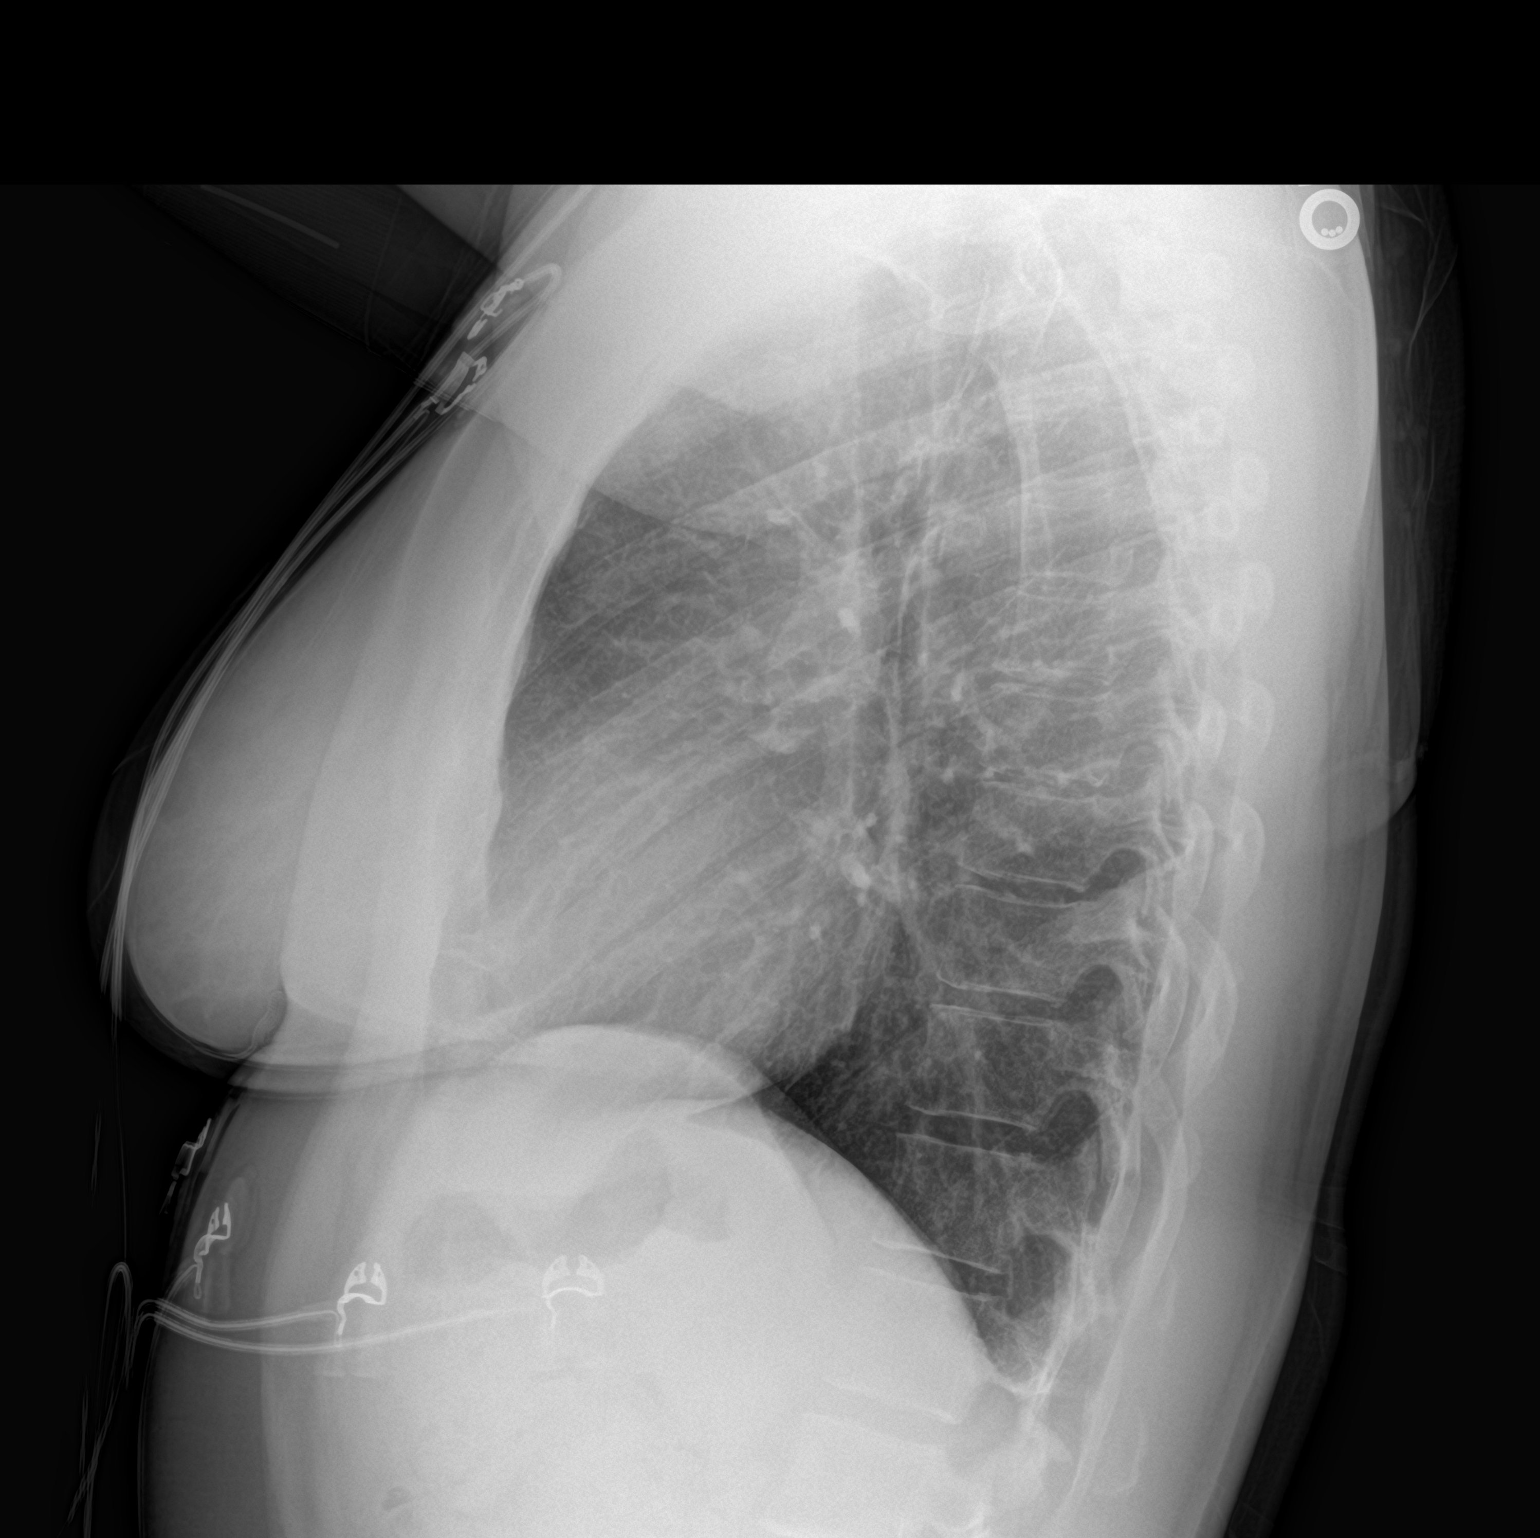

[2 of 2 positions shown; findings below may reference images not displayed]

FINDINGS: The heart size and mediastinal contours are within normal limits.
Both lungs are clear. The visualized skeletal structures are
unremarkable.
IMPRESSION: No active cardiopulmonary disease.

## 2022-02-04 MED ORDER — FAMOTIDINE 20 MG PO TABS: 20.0000 mg | ORAL_TABLET | Freq: Two times a day (BID) | ORAL | 0 refills | Status: DC

## 2022-02-04 MED ORDER — ALUM & MAG HYDROXIDE-SIMETH 200-200-20 MG/5ML PO SUSP
30.0000 mL | Freq: Once | ORAL | Status: AC
  Administered 2022-02-04: 30 mL via ORAL
  Filled 2022-02-04: qty 30

## 2022-02-04 NOTE — Discharge Instructions (Signed)
Take Pepcid daily as needed.  Follow-up with your primary in the next week or so the symptoms persist.  The work-up today was reassuring, does not appear to be any acute process.

## 2022-02-04 NOTE — ED Triage Notes (Signed)
Patient c/o upper chest pressure and SOB x 1 week. Paient states she gets SOB after walking for a few minutes or talking for a period of time.

## 2022-02-04 NOTE — ED Provider Notes (Signed)
[facility]-EMERGENCY DEPT Provider Note   CSN: [id number] Arrival date & time: 02/04/22  1425     History  Chief Complaint  Patient presents with   Chest Pain   Shortness of Breath    Barbara BarefootSaray Mcfarland is a 31 y.o. female.   Chest Pain Associated symptoms: shortness of breath   Shortness of Breath Associated symptoms: chest pain    Patient presents with a week of chest pressure.  Denies any specific pain, she also reports feeling short of breath particularly when she ambulates for long periods of time.  Does not smoke cigarettes, not on oral birth control.  No recent travel or surgeries, no history of MI or PE.  Has not tried anything for the pressure, it is worse after she eats.  Home Medications Prior to Admission medications   Medication Sig Start Date End Date Taking? Authorizing Provider  famotidine (PEPCID) 20 MG tablet Take 1 tablet (20 mg total) by mouth 2 (two) times daily. 02/04/22  Yes Theron AristaSage, Charizma Gardiner, PA-C  benzonatate (TESSALON) 100 MG capsule Take 1 capsule (100 mg total) by mouth 3 (three) times daily as needed for cough. 01/30/22   Rema FendtStephens, Amy J, NP  etonogestrel (NEXPLANON) 68 MG IMPL implant 1 each by Subdermal route once. 07/05/21   [provider]  gabapentin (NEURONTIN) 100 MG capsule Take 1 capsule (100 mg total) by mouth 3 (three) times daily. 08/13/21   Myra RudeSchmitz, Jeremy E, MD  meclizine (ANTIVERT) 12.5 MG tablet Take 1 tablet (12.5 mg total) by mouth 3 (three) times daily as needed for dizziness. 01/30/22   Rema FendtStephens, Amy J, NP  Vitamin D, Ergocalciferol, (DRISDOL) 1.25 MG (50000 UNIT) CAPS capsule Take 1 capsule (50,000 Units total) by mouth every 7 (seven) days. 01/31/22   Rema FendtStephens, Amy J, NP      Allergies    Patient has no known allergies.    Review of Systems   Review of Systems  Respiratory:  Positive for shortness of breath.   Cardiovascular:  Positive for chest pain.   Physical Exam Updated Vital Signs BP 139/80 (BP Location:  Right Arm)    Pulse 84    Temp 98.4 F (36.9 C) (Oral)    Resp 16    Ht 5\' 6"  (1.676 m)    Wt 80.3 kg    SpO2 99%    BMI 28.57 kg/m  Physical Exam Vitals and nursing note reviewed. Exam conducted with a chaperone present.  Constitutional:      Appearance: Normal appearance.  HENT:     Head: Normocephalic and atraumatic.  Eyes:     General: No scleral icterus.       Right eye: No discharge.        Left eye: No discharge.     Extraocular Movements: Extraocular movements intact.     Pupils: Pupils are equal, round, and reactive to light.  Cardiovascular:     Rate and Rhythm: Normal rate and regular rhythm.     Pulses: Normal pulses.     Heart sounds: Normal heart sounds. No murmur heard.   No friction rub. No gallop.  Pulmonary:     Effort: Pulmonary effort is normal. No respiratory distress.     Breath sounds: Normal breath sounds.  Abdominal:     General: Abdomen is flat. Bowel sounds are normal. There is no distension.     Palpations: Abdomen is soft.     Tenderness: There is no abdominal tenderness.  Skin:    General:  Skin is warm and dry.     Coloration: Skin is not jaundiced.  Neurological:     Mental Status: She is alert. Mental status is at baseline.     Coordination: Coordination normal.    ED Results / Procedures / Treatments   Labs (all labs ordered are listed, but only abnormal results are displayed) Labs Reviewed  CBC - Abnormal; Notable for the following components:      Result Value   Hemoglobin 15.4 (*)    All other components within normal limits  RESP PANEL BY RT-PCR (FLU A&B, COVID) ARPGX2  BASIC METABOLIC PANEL  I-STAT BETA HCG BLOOD, ED ([facility], [facility], [facility] ONLY)  TROPONIN I (HIGH SENSITIVITY)    EKG EKG Interpretation  Date/Time:  Monday February 04 2022 14:36:04 EST Ventricular Rate:  86 PR Interval:  133 QRS Duration: 86 QT Interval:  357 QTC Calculation: 427 R Axis:   77 Text Interpretation: Sinus rhythm Consider left atrial enlargement  Confirmed by Dene Gentry [phone]) on 02/04/2022 3:38:58 PM  Radiology DG Chest 2 View  Result Date: 02/04/2022 CLINICAL DATA:  Chest pain. EXAM: CHEST - 2 VIEW COMPARISON:  None. FINDINGS: The heart size and mediastinal contours are within normal limits. Both lungs are clear. The visualized skeletal structures are unremarkable. IMPRESSION: No active cardiopulmonary disease. Electronically Signed   By: Ronney Asters M.D.   On: 02/04/2022 15:11    Procedures Procedures    Medications Ordered in ED Medications  alum & mag hydroxide-simeth (MAALOX/MYLANTA) 200-200-20 MG/5ML suspension 30 mL (30 mLs Oral Given 02/04/22 1540)    ED Course/ Medical Decision Making/ A&P                           Medical Decision Making Amount and/or Complexity of Data Reviewed Labs: ordered. Radiology: ordered.  Risk OTC drugs.   This patient presents to the ED for concern of chest pain and shortness of breath, this involves an extensive number of treatment options, and is a complaint that carries with it a high risk of complications and morbidity.  The differential diagnosis includes PE, ACS, anxiety, GERD, other   Co morbidities that complicate the patient evaluation: N/A   Additional history obtained: -Additional history obtained from family member at bedside -External records from outside source obtained and reviewed including: Chart review including previous notes, labs, imaging, consultation notes   Lab Tests: -I ordered, reviewed, and interpreted labs.  The pertinent results include: No leukocytosis or anemia.  Patient is not pregnant, no lecture light derangement.  Flu and COVID-negative.  No leukocytosis or anemia.  No gross electrolyte derangement, patient is negative initial troponin.  Do not feel she needs a second troponin.   EKG -Sinus rhythm, no ischemic findings   Imaging Studies ordered: -I ordered imaging studies including chest x-ray -I independently visualized and interpreted  imaging which showed  -I agree with the radiologist interpretation   Medicines ordered and prescription drug management: -I ordered medication including GI cocktail for   -Reevaluation of the patient after these medicines showed that the patient improved -I have reviewed the patients home medicines and have made adjustments as needed   ED Course: Heart score of 1.  Given the pain is worse after eating suspect the pain is likely secondary to GERD.  I do not have a high suspicion for ACS, negative this troponin and no ischemic findings on EKG.  X-ray without any evidence of pneumonia or cardiomegaly concerning for  acute CHF.  Lungs are clear to auscultation, patient is not hypoxic.  She is PERC negative.  Do not think she needs emergent work-up at this time.     Reevaluation: After the interventions noted above, I reevaluated the patient and found that they have :improved   Dispostion: D/C         Final Clinical Impression(s) / ED Diagnoses Final diagnoses:  Chest pain, unspecified type    Rx / DC Orders ED Discharge Orders          Ordered    famotidine (PEPCID) 20 MG tablet  2 times daily        02/04/22 1715              Sherrill Raring, [place] 02/04/22 2140    Valarie Merino, MD 02/07/22 [phone]

## 2022-02-19 ENCOUNTER — Ambulatory Visit: Payer: 59 | Admitting: Family

## 2022-02-21 ENCOUNTER — Emergency Department (HOSPITAL_COMMUNITY): Payer: Managed Care, Other (non HMO)

## 2022-02-21 ENCOUNTER — Other Ambulatory Visit: Payer: Self-pay

## 2022-02-21 ENCOUNTER — Emergency Department (HOSPITAL_COMMUNITY)
Admission: EM | Admit: 2022-02-21 | Discharge: 2022-02-21 | Disposition: A | Discharge status: Home / Self Care | Payer: Managed Care, Other (non HMO) | Attending: Emergency Medicine | Admitting: Emergency Medicine

## 2022-02-21 ENCOUNTER — Encounter (HOSPITAL_COMMUNITY): Payer: Self-pay | Admitting: *Deleted

## 2022-02-21 DIAGNOSIS — M545 Low back pain, unspecified: Secondary | ICD-10-CM | POA: Insufficient documentation

## 2022-02-21 DIAGNOSIS — W1839XA Other fall on same level, initial encounter: Secondary | ICD-10-CM | POA: Insufficient documentation

## 2022-02-21 DIAGNOSIS — M542 Cervicalgia: Secondary | ICD-10-CM | POA: Insufficient documentation

## 2022-02-21 IMAGING — CR DG LUMBAR SPINE COMPLETE 4+V
5 series · 5 of 5 positions shown · non-contrast
Comparison: None.

CLINICAL DATA: Fall, low back pain

EXAM:
LUMBAR SPINE - COMPLETE 4+ VIEW

[t lumbar spine ap]
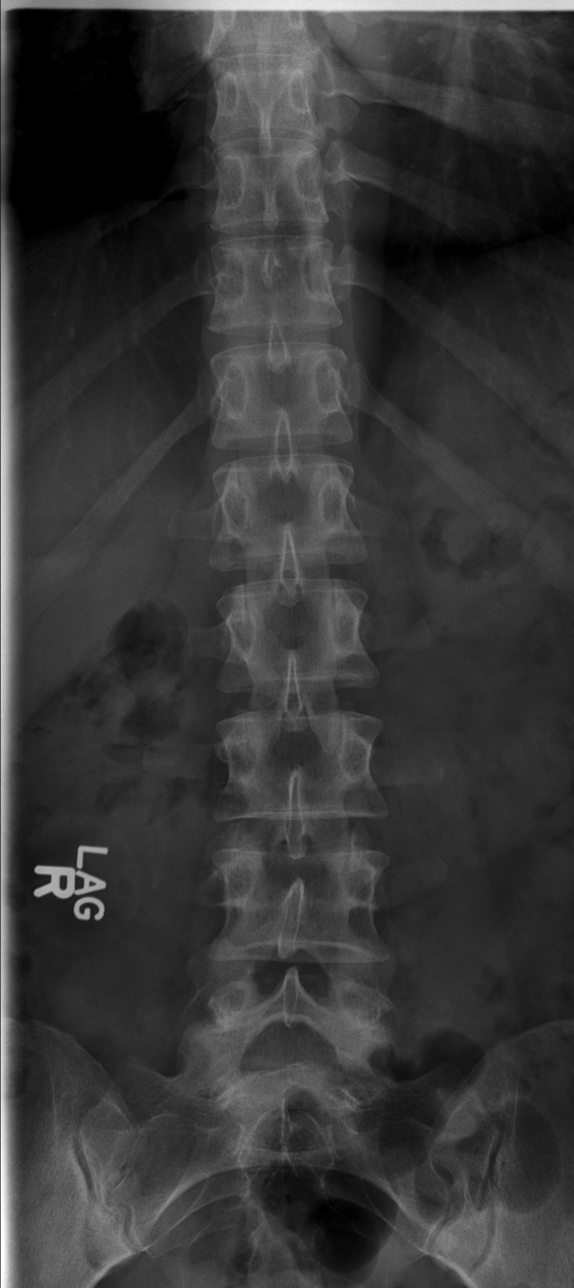

[t lumbar spine obl (1 of 2)]
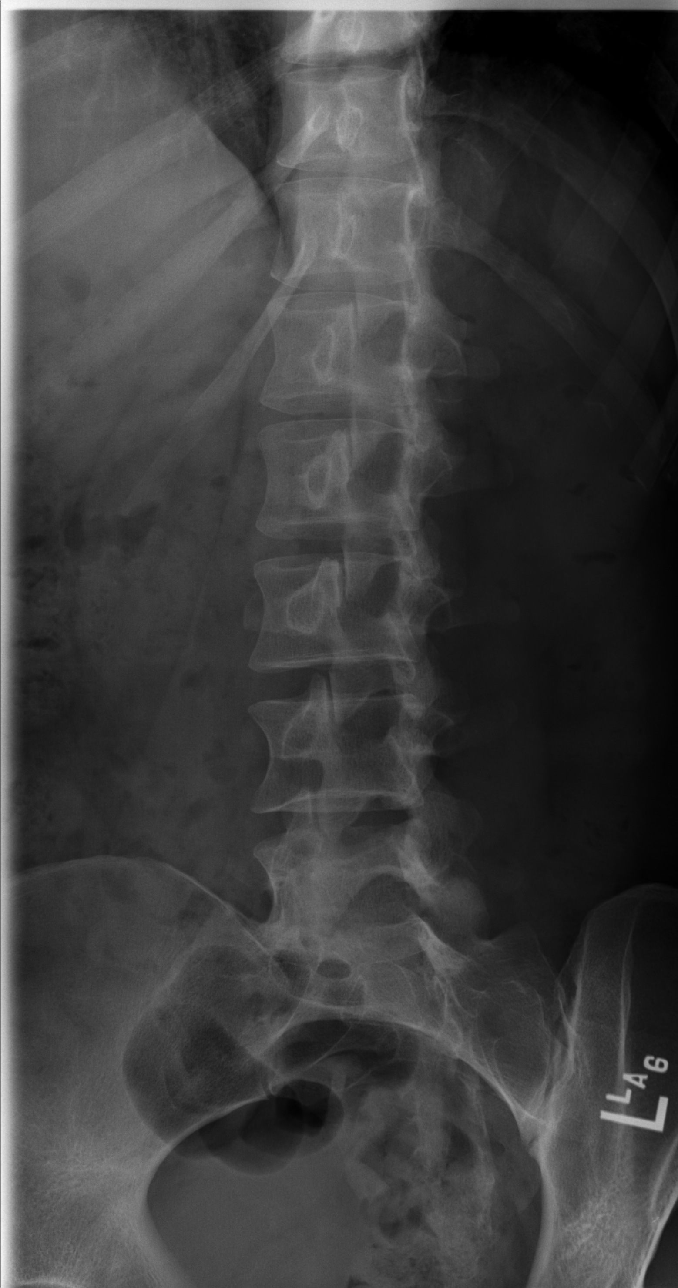

[t lumbar spine obl (2 of 2)]
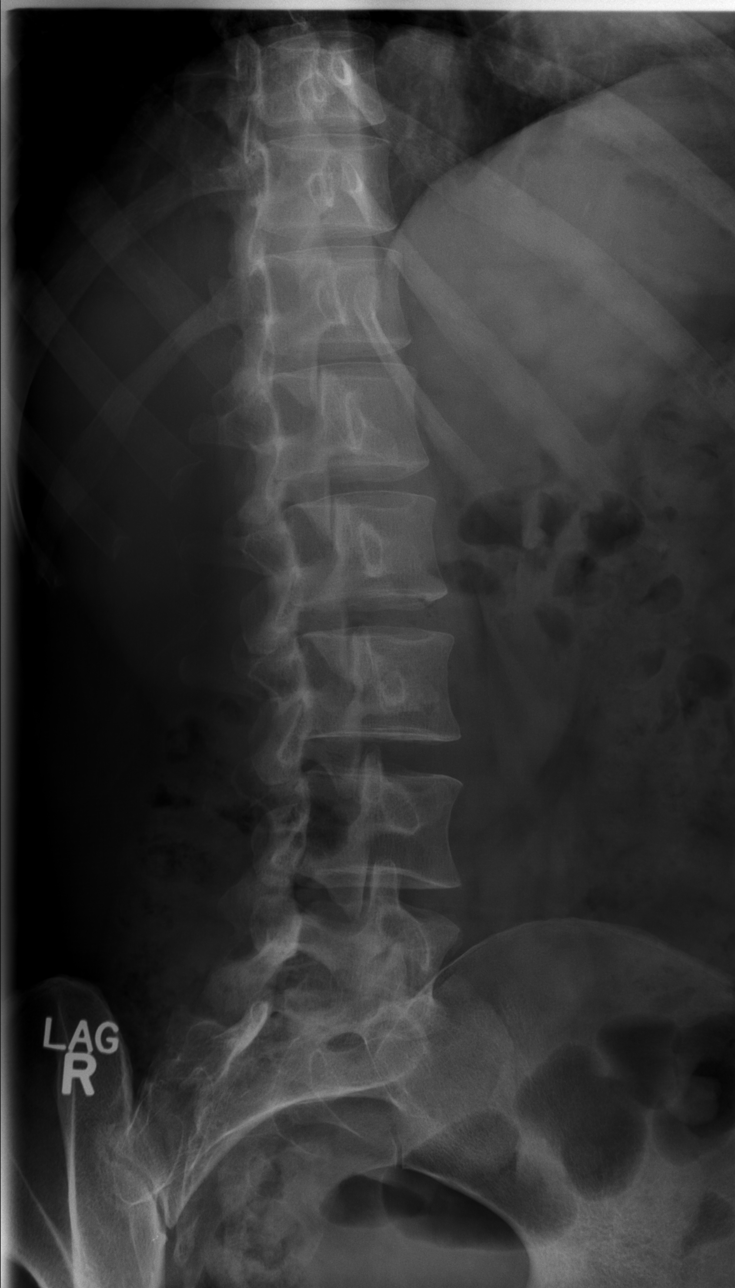

[t lumbar spine lat]
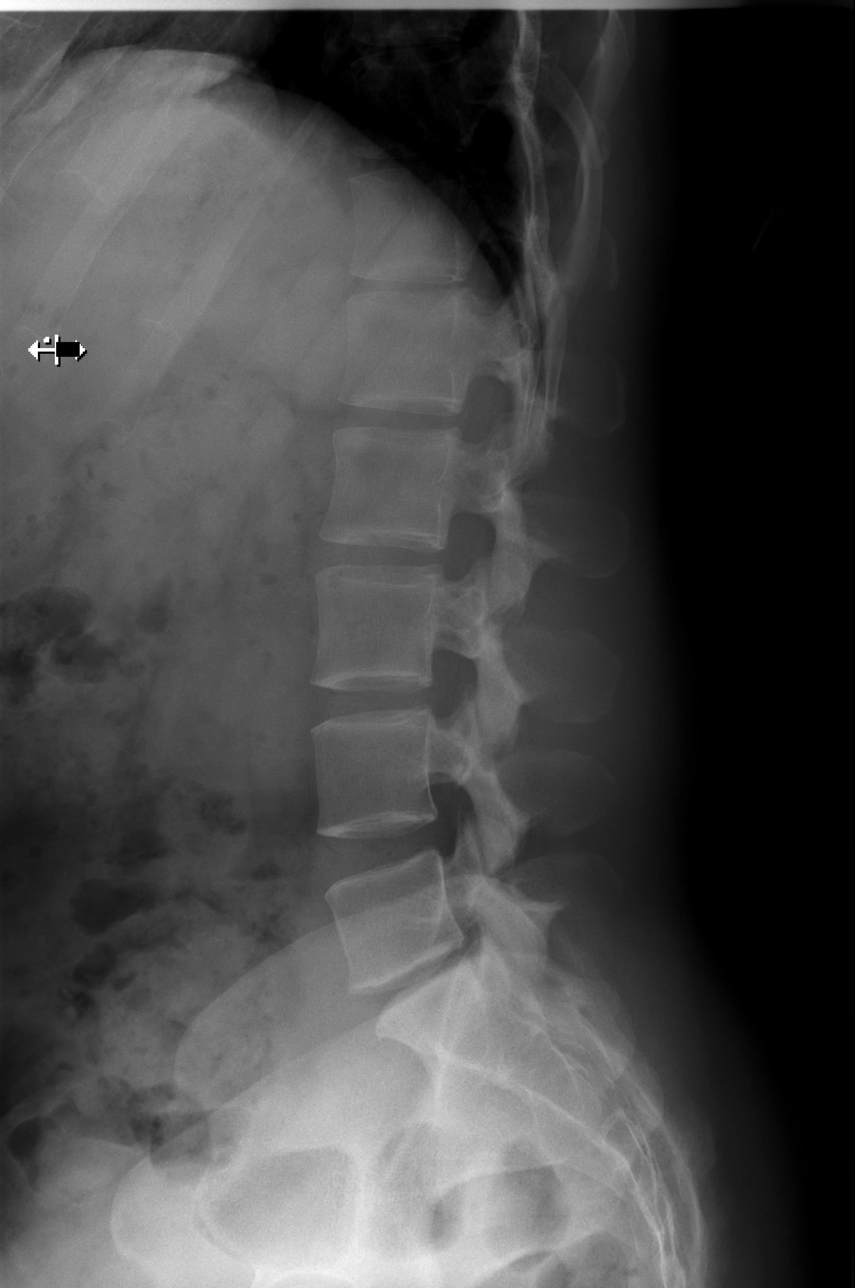

[t lumbar l-5 s-1 spot]
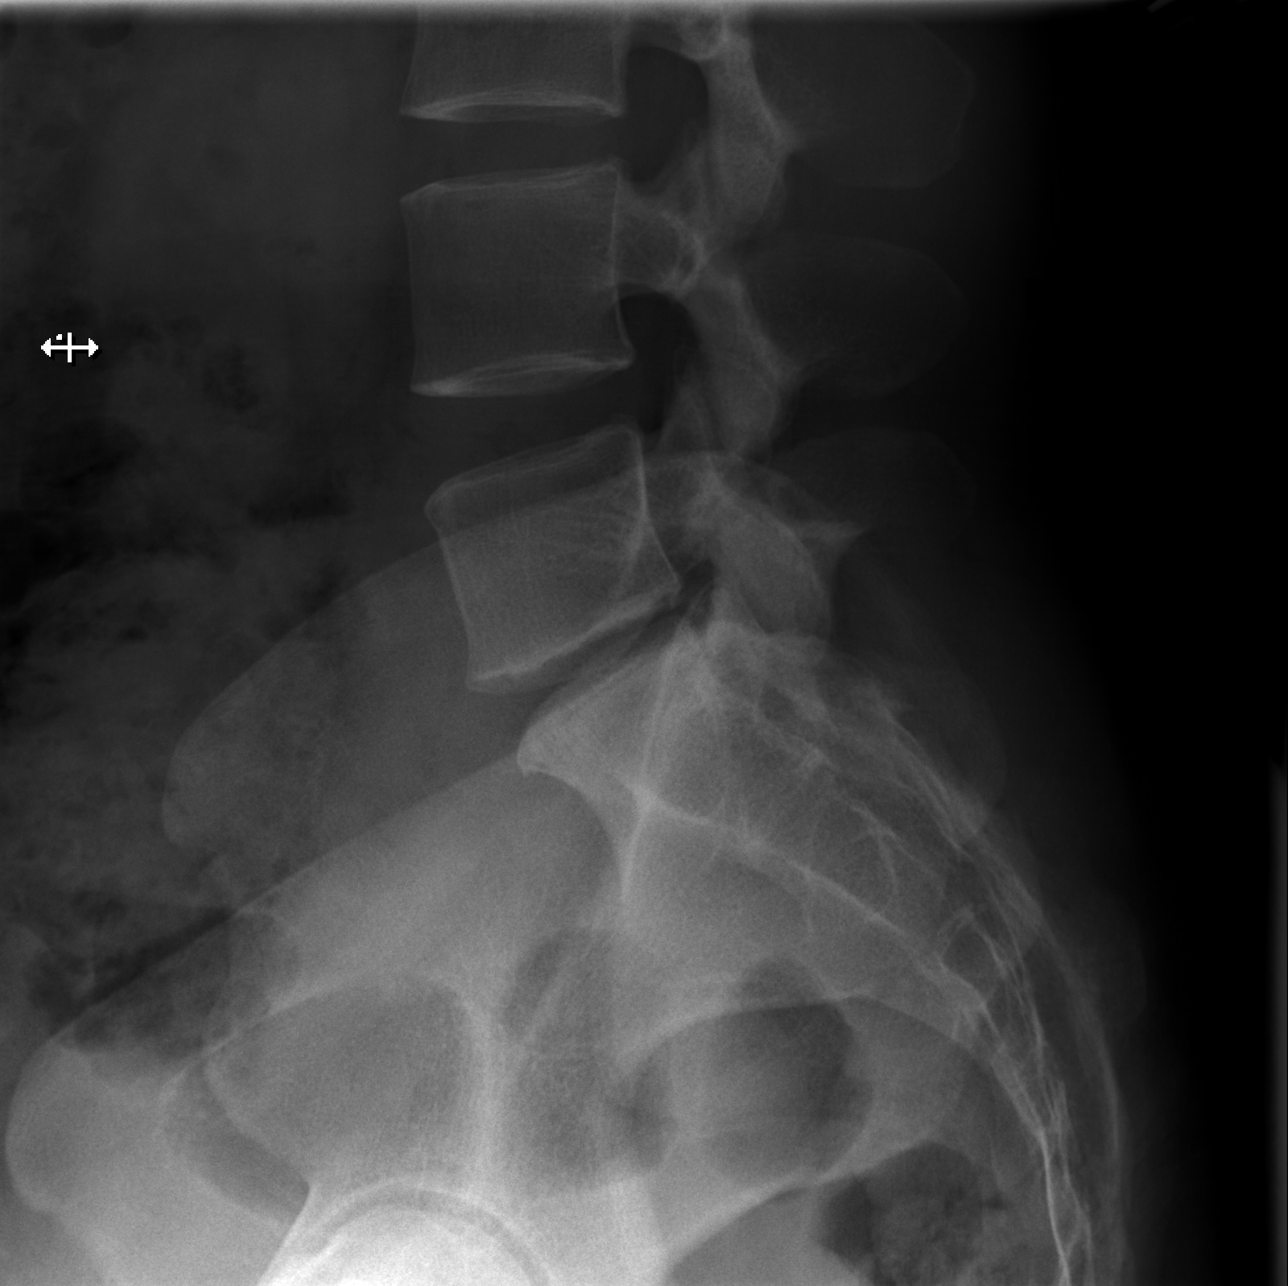

[5 of 5 positions shown; findings below may reference images not displayed]

FINDINGS: There is no evidence of lumbar spine fracture. Alignment is normal.
Intervertebral disc spaces are maintained.
IMPRESSION: Negative.

## 2022-02-21 IMAGING — CT CT CERVICAL SPINE W/O CM
3 of 4 series · 13 of 33 positions shown, 16 images · non-contrast
Comparison: None.

CLINICAL DATA: Fall with neck pain



[Series 7: orthogonal bone · axial · 0.33mm/px · z∈[-202,-52]mm · 5 of 114 slices shown, 7 images]
[im 17/114  soft-tissue]
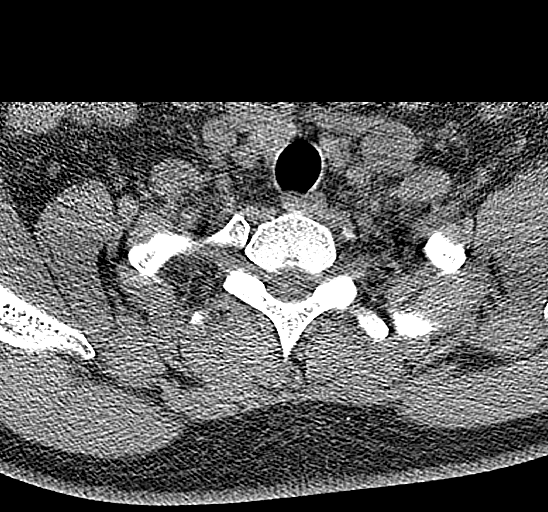
[im 17/114  bone]
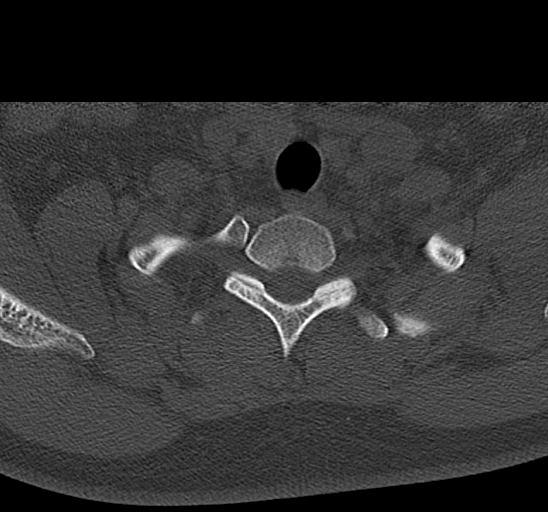
[im 33/114  bone]
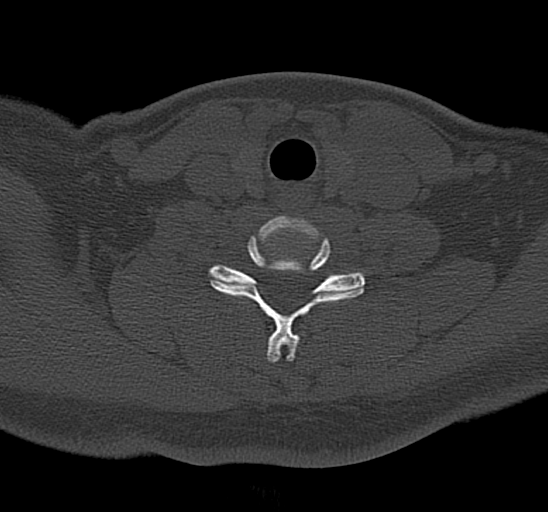
[im 65/114  bone]
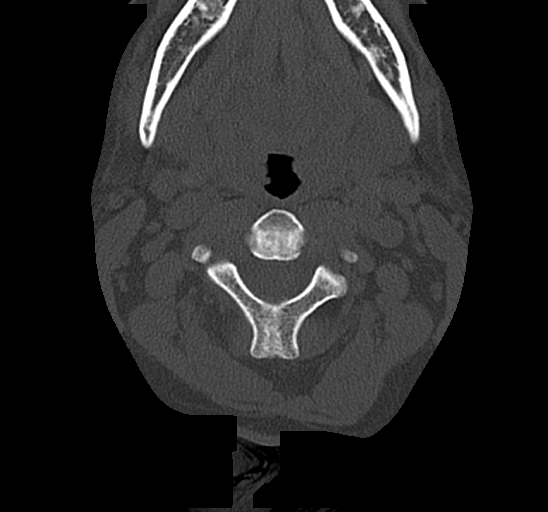
[im 81/114  bone]
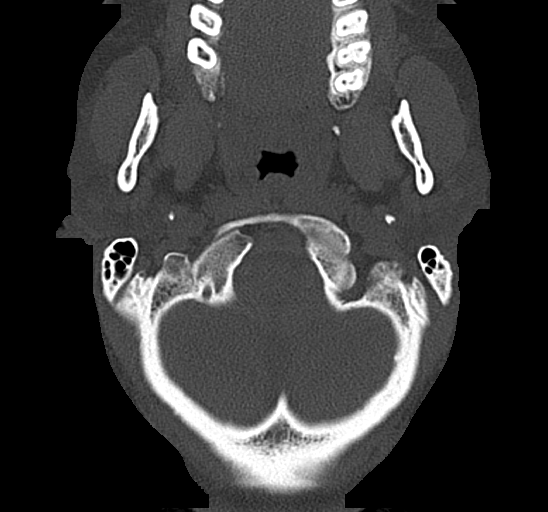
[im 97/114  soft-tissue]
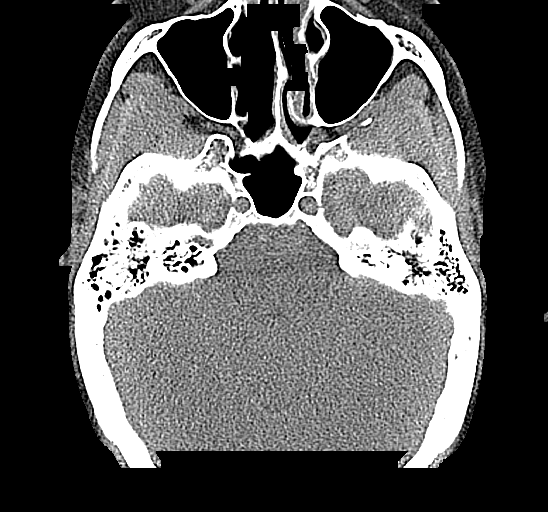
[im 97/114  bone]
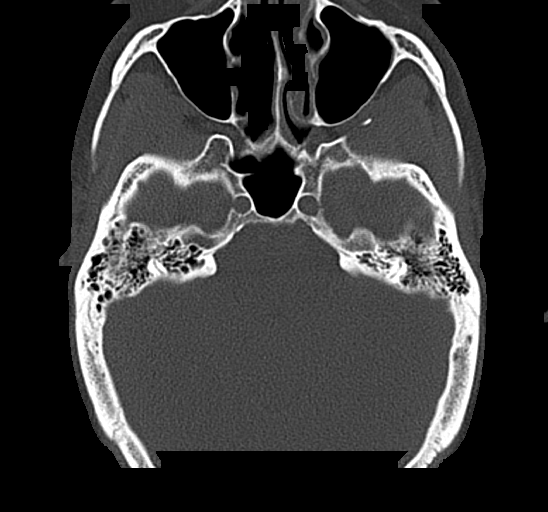

[Series 8: coronal bone · coronal · 0.36mm/px · 3 of 86 slices shown]
[im 22/86  bone]
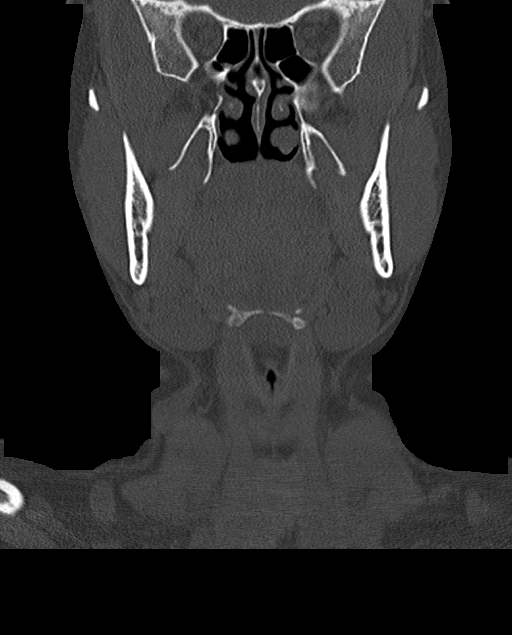
[im 36/86  bone]
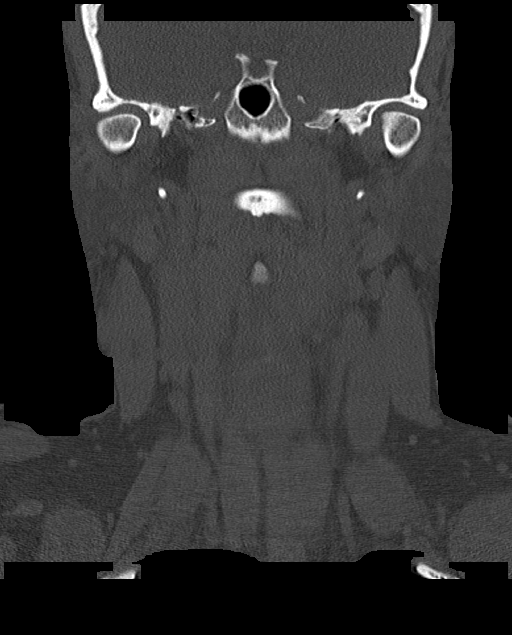
[im 50/86  bone]
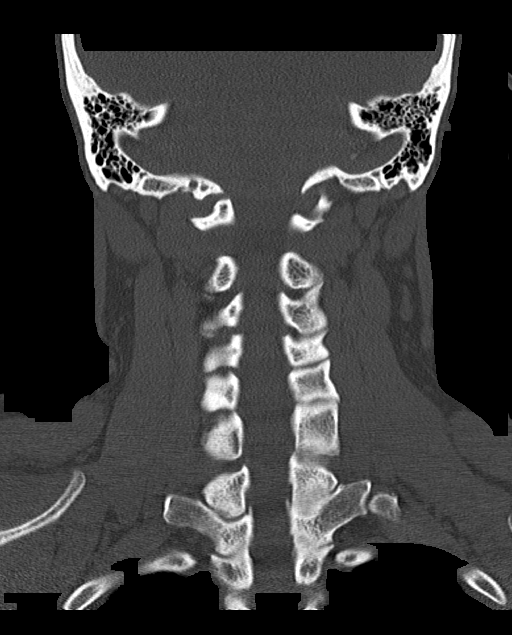

[Series 9: sagittal bone · sagittal · 0.33mm/px · 5 of 92 slices shown, 6 images]
[im 31/92  bone]
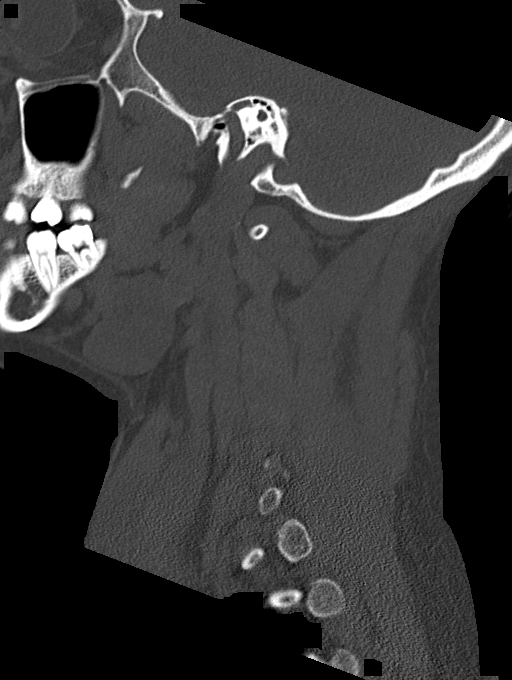
[im 38/92  bone]
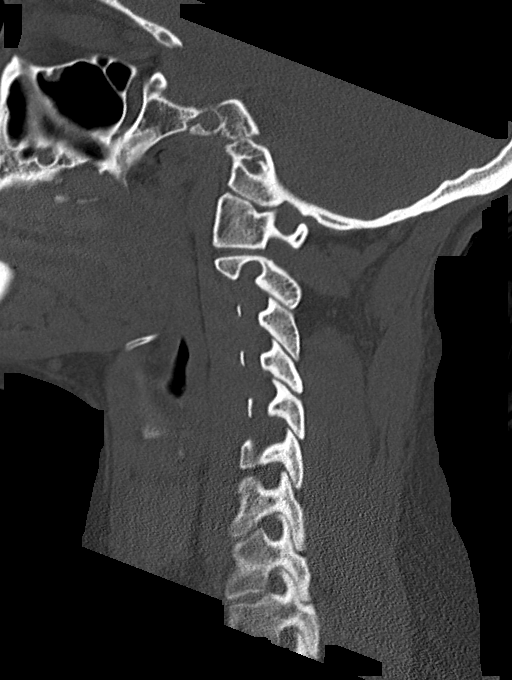
[im 46/92  soft-tissue]
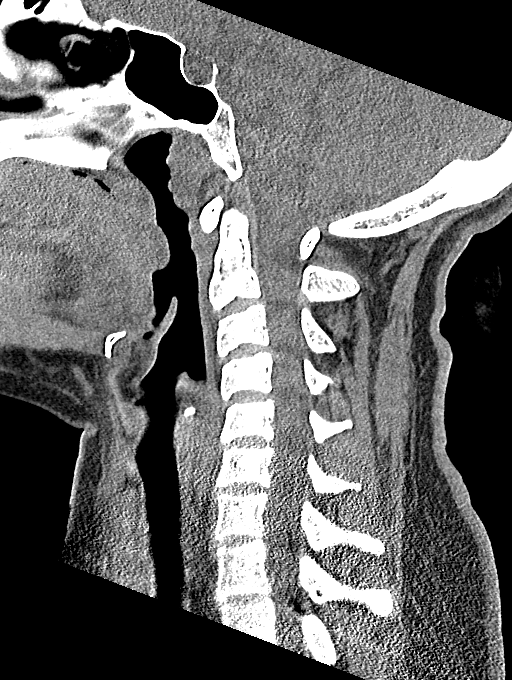
[im 46/92  bone]
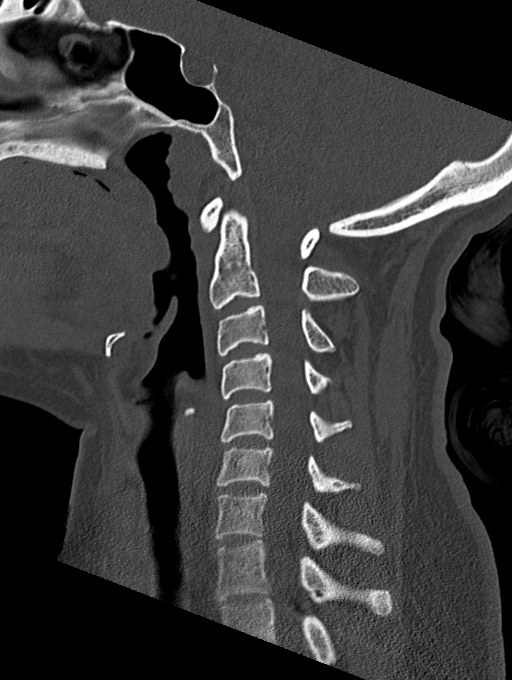
[im 54/92  bone]
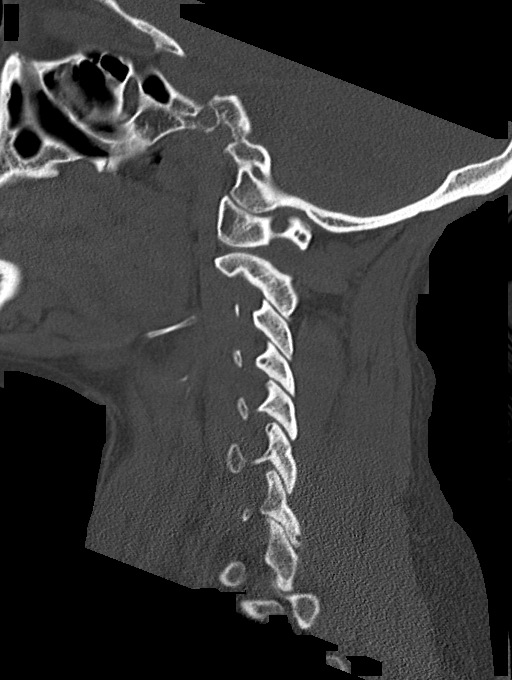
[im 61/92  bone]
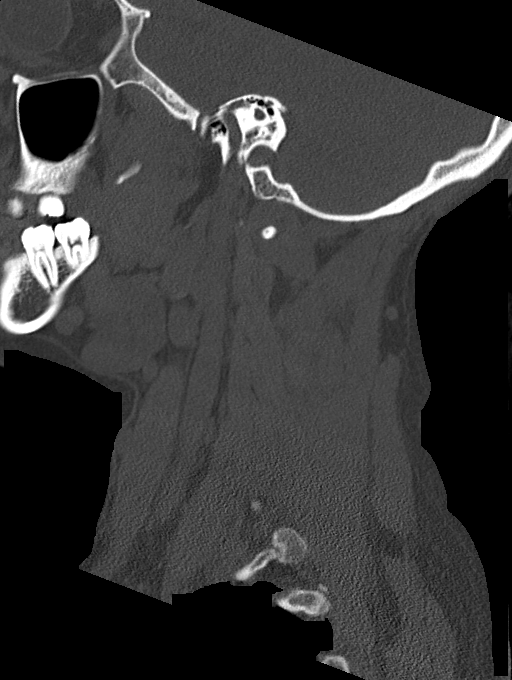

[13 of 33 positions shown; findings below may reference images not displayed]

FINDINGS: Alignment: Mild reversal of cervical lordosis. No subluxation. Facet
alignment within normal limits

Skull base and vertebrae: No acute fracture. No primary bone lesion
or focal pathologic process.

Soft tissues and spinal canal: No prevertebral fluid or swelling. No
visible canal hematoma.

Disc levels:  Within normal limits

Upper chest: Negative.

Other: None
IMPRESSION: Mild reversal of cervical lordosis.  No acute osseous abnormality.

## 2022-02-21 MED ORDER — DICLOFENAC SODIUM 75 MG PO TBEC: 75.0000 mg | DELAYED_RELEASE_TABLET | Freq: Two times a day (BID) | ORAL | 0 refills | Status: DC

## 2022-02-21 MED ORDER — METHOCARBAMOL 500 MG PO TABS: 500.0000 mg | ORAL_TABLET | Freq: Four times a day (QID) | ORAL | 0 refills | Status: DC

## 2022-02-21 NOTE — ED Triage Notes (Signed)
Pt states yesterday, she fell onto her buttock and then on to her back and head. Denies LOC. Pt c/o pain in the back of her head, in the left neck, lower back. Denies any numbness and tingling.
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
## 2022-02-21 NOTE — Discharge Instructions (Addendum)
Return if any problems.

## 2022-02-21 NOTE — ED Notes (Signed)
An After Visit Summary was printed and given to the patient. Discharge instructions given and no further questions at this time.  

## 2022-03-21 ENCOUNTER — Encounter: Payer: Managed Care, Other (non HMO) | Admitting: Diagnostic Neuroimaging

## 2022-03-21 ENCOUNTER — Other Ambulatory Visit: Payer: Self-pay

## 2022-03-21 ENCOUNTER — Ambulatory Visit (INDEPENDENT_AMBULATORY_CARE_PROVIDER_SITE_OTHER): Payer: Managed Care, Other (non HMO) | Admitting: Diagnostic Neuroimaging

## 2022-03-21 DIAGNOSIS — Z0289 Encounter for other administrative examinations: Secondary | ICD-10-CM

## 2022-03-21 DIAGNOSIS — M79602 Pain in left arm: Secondary | ICD-10-CM | POA: Diagnosis not present

## 2022-03-21 NOTE — Procedures (Signed)
? ?  [facility] ? ?NCS (NERVE CONDUCTION STUDY) WITH EMG (ELECTROMYOGRAPHY) REPORT ? ? ?STUDY DATE: 03/21/22 ?PATIENT NAME: Barbara Mcfarland ?DOB: 10/21/1991 ?MRN: [id number] ? ?ORDERING CLINICIAN: Joycelyn Schmid, MD  ? ?TECHNOLOGIST: Durenda Age ?ELECTROMYOGRAPHER: Marik Sedore R. Omero Kowal, MD ? ?CLINICAL INFORMATION: 31 year old female with left arm pain. ? ?FINDINGS: ?NERVE CONDUCTION STUDY: ? ?Left median and left ulnar motor response is normal. ? ?Left median and left ulnar sensory responses are normal. ? ?Left ulnar F wave latency is normal. ? ? ? ?NEEDLE ELECTROMYOGRAPHY: ? ?Needle examination of left upper extremity is normal. ? ? ?IMPRESSION:  ? ?Normal study.  No electrodiagnostic evidence of large fiber neuropathy at this time. ? ? ? ?INTERPRETING PHYSICIAN:  ?Suanne Marker, MD ?Certified in Neurology, Neurophysiology and Neuroimaging ? ?[facility] ?[address], [address] ?[place], [place] [address] ?[phone] ? ?[facility] ?   ?Nerve / Sites Muscle Latency Ref. Amplitude Ref. Rel Amp Segments Distance Velocity Ref. Area  ?  ms ms mV mV %  cm m/s m/s mVms  ?L Median - APB  ?   Wrist APB 2.8 ?4.4 10.7 ?4.0 100 Wrist - APB 7   39.2  ?   Upper arm APB 6.1  10.7  99.8 Upper arm - Wrist 21 64 ?49 38.7  ?L Ulnar - ADM  ?   Wrist ADM 2.3 ?3.3 11.6 ?6.0 100 Wrist - ADM 7   33.6  ?   B.Elbow ADM 4.6  11.0  94.7 B.Elbow - Wrist 18 78 ?49 33.5  ?   A.Elbow ADM 6.1  10.6  96.5 A.Elbow - B.Elbow 10 67 ?49 31.4  ?       ?SNC ?   ?Nerve / Sites Rec. Site Peak Lat Ref.  Amp Ref. Segments Distance  ?  ms ms ?V ?V  cm  ?L Median - Orthodromic (Dig II, Mid palm)  ?   Dig II Wrist 2.5 ?3.4 24 ?10 Dig II - Wrist 13  ?L Ulnar - Orthodromic, (Dig V, Mid palm)  ?   Dig V Wrist 2.4 ?3.1 12 ?5 Dig V - Wrist 11  ?       ?F  Wave ?   ?Nerve F Lat Ref.  ? ms ms  ?L Ulnar - ADM 24.1 ?32.0  ?     ?EMG Summary Table   ? Spontaneous MUAP Recruitment  ?Muscle IA Fib PSW Fasc Other Amp Dur. Poly Pattern  ?L.  Deltoid Normal None None None _______ Normal Normal Normal Normal  ?L. Biceps brachii Normal None None None _______ Normal Normal Normal Normal  ?L. Triceps brachii Normal None None None _______ Normal Normal Normal Normal  ?L. Flexor carpi radialis Normal None None None _______ Normal Normal Normal Normal  ?L. First dorsal interosseous Normal None None None _______ Normal Normal Normal Normal  ? ?  ?

## 2022-03-25 NOTE — Progress Notes (Signed)
Erroneous encounter

## 2022-03-27 ENCOUNTER — Encounter: Payer: Managed Care, Other (non HMO) | Admitting: Family

## 2022-03-27 ENCOUNTER — Other Ambulatory Visit: Payer: Self-pay

## 2022-04-17 ENCOUNTER — Encounter: Payer: Managed Care, Other (non HMO) | Admitting: Nurse Practitioner

## 2022-04-17 ENCOUNTER — Telehealth: Payer: Self-pay | Admitting: Family Medicine

## 2022-04-17 NOTE — Telephone Encounter (Signed)
Pt called back states since she never had the MRI done , pls cancel the order feels its  not needed . ?--glh ?

## 2022-04-17 NOTE — Telephone Encounter (Signed)
Answered patients questions.  ? ?Myra Rude, MD ?[facility] ?04/17/2022, 5:45 PM ? ?

## 2022-04-17 NOTE — Telephone Encounter (Signed)
Pt called states she had the Neurology study/ appt 03/21/22 request a call back w/ results. ?. ?--Forwarding  message to med asst for review w/ Dr.Schmitz. ? ?-glh ?

## 2022-04-22 ENCOUNTER — Telehealth: Payer: Self-pay | Admitting: Family Medicine

## 2022-04-22 DIAGNOSIS — G540 Brachial plexus disorders: Secondary | ICD-10-CM

## 2022-04-22 NOTE — Telephone Encounter (Signed)
New MRI order placed

## 2022-04-22 NOTE — Telephone Encounter (Signed)
Pt called back says rcvd Neuro study results form Dr.Schmitz and now wants to proceed w/ rescheduling MRI that was Originally order Dec 2022. ? ?--Forwarding message to medical asst for Pre-cert review. ? ?--glh ?

## 2022-04-28 ENCOUNTER — Other Ambulatory Visit: Payer: Managed Care, Other (non HMO)

## 2022-04-30 ENCOUNTER — Other Ambulatory Visit: Payer: Managed Care, Other (non HMO)

## 2022-05-02 ENCOUNTER — Encounter (HOSPITAL_COMMUNITY): Payer: Self-pay

## 2022-05-02 ENCOUNTER — Other Ambulatory Visit: Payer: Self-pay

## 2022-05-02 ENCOUNTER — Emergency Department (HOSPITAL_COMMUNITY)
Admission: EM | Admit: 2022-05-02 | Discharge: 2022-05-02 | Disposition: A | Discharge status: Home / Self Care | Payer: Managed Care, Other (non HMO) | Attending: Emergency Medicine | Admitting: Emergency Medicine

## 2022-05-02 DIAGNOSIS — G43809 Other migraine, not intractable, without status migrainosus: Secondary | ICD-10-CM | POA: Insufficient documentation

## 2022-05-02 DIAGNOSIS — R519 Headache, unspecified: Secondary | ICD-10-CM | POA: Diagnosis present

## 2022-05-02 LAB — BASIC METABOLIC PANEL
Anion gap: 5 (ref 5–15)
BUN: 16 mg/dL (ref 6–20)
CO2: 24 mmol/L (ref 22–32)
Calcium: 9.2 mg/dL (ref 8.9–10.3)
Chloride: 110 mmol/L (ref 98–111)
Creatinine, Ser: 0.78 mg/dL (ref 0.44–1.00)
GFR, Estimated: >60 mL/min (ref >60)
Glucose, Bld: 96 mg/dL (ref 70–99)
Potassium: 3.9 mmol/L (ref 3.5–5.1)
Sodium: 139 mmol/L (ref 135–145)

## 2022-05-02 LAB — CBC
HCT: 44.2 % (ref 36.0–46.0)
Hemoglobin: 15.5 g/dL — ABNORMAL HIGH (ref 12.0–15.0)
MCH: 31.8 pg (ref 26.0–34.0)
MCHC: 35.1 g/dL (ref 30.0–36.0)
MCV: 90.8 fL (ref 80.0–100.0)
Platelets: 217 10*3/uL (ref 150–400)
RBC: 4.87 MIL/uL (ref 3.87–5.11)
RDW: 12.1 % (ref 11.5–15.5)
WBC: 6.6 10*3/uL (ref 4.0–10.5)
nRBC: 0 % (ref 0.0–0.2)

## 2022-05-02 LAB — MAGNESIUM: Magnesium: 2.2 mg/dL (ref 1.7–2.4)

## 2022-05-02 MED ORDER — METOCLOPRAMIDE HCL 10 MG PO TABS
10.0000 mg | ORAL_TABLET | Freq: Two times a day (BID) | ORAL | 0 refills | Status: DC | PRN
Start: 2022-05-02 — End: 2023-05-25

## 2022-05-02 MED ORDER — KETOROLAC TROMETHAMINE 15 MG/ML IJ SOLN
15.0000 mg | Freq: Once | INTRAMUSCULAR | Status: AC
  Administered 2022-05-02: 15 mg via INTRAVENOUS
  Filled 2022-05-02: qty 1

## 2022-05-02 MED ORDER — DIPHENHYDRAMINE HCL 50 MG/ML IJ SOLN
25.0000 mg | Freq: Once | INTRAMUSCULAR | Status: AC
  Administered 2022-05-02: 25 mg via INTRAVENOUS
  Filled 2022-05-02: qty 1

## 2022-05-02 MED ORDER — MAGNESIUM SULFATE 2 GM/50ML IV SOLN
2.0000 g | Freq: Once | INTRAVENOUS | Status: AC
Start: 2022-05-02 — End: 2022-05-02
  Administered 2022-05-02: 2 g via INTRAVENOUS
  Filled 2022-05-02: qty 50

## 2022-05-02 MED ORDER — DIPHENHYDRAMINE HCL 25 MG PO TABS: 25.0000 mg | ORAL_TABLET | Freq: Every evening | ORAL | 0 refills | Status: DC | PRN

## 2022-05-02 MED ORDER — SODIUM CHLORIDE 0.9 % IV BOLUS
1000.0000 mL | Freq: Once | INTRAVENOUS | Status: AC
  Administered 2022-05-02: 1000 mL via INTRAVENOUS

## 2022-05-02 MED ORDER — IBUPROFEN 600 MG PO TABS: 600.0000 mg | ORAL_TABLET | Freq: Four times a day (QID) | ORAL | 0 refills | Status: AC | PRN

## 2022-05-02 MED ORDER — METOCLOPRAMIDE HCL 5 MG/ML IJ SOLN
10.0000 mg | Freq: Once | INTRAMUSCULAR | Status: AC
Start: 2022-05-02 — End: 2022-05-02
  Administered 2022-05-02: 10 mg via INTRAVENOUS
  Filled 2022-05-02: qty 2

## 2022-05-02 NOTE — ED Triage Notes (Signed)
Patient c/o migraine and emesis x 7 days. Patient reports a history of migraines. ?

## 2022-05-02 NOTE — ED Provider Notes (Signed)
?[facility]PT ?Provider Note ? ? ?CSN: [id number] ?Arrival date & time: 05/02/22  [id number] ? ?  ? ?History ? ?Chief Complaint  ?Patient presents with  ? Migraine  ? Emesis  ? ? ?Barbara Mcfarland is a 31 y.o. female presenting to ED with complaint of a headache and a migraine ongoing for about 6 or 7 days.  She says she does suffer from migraines will often have them for a few days but this is longer than normal.  She also reports that she has been extremely nauseated due to her headache pain, and had several episodes of vomiting.  In the past she had an MRI performed approximately a year ago which showed no significant abnormalities to explain her headaches.  She had does not see a neurologist and does not take migraine medications regularly.  She feels she has not been able to keep down much food for the past week. ? ?HPI ? ?  ? ?Home Medications ?Prior to Admission medications   ?Medication Sig Start Date End Date Taking? Authorizing Provider  ?diphenhydrAMINE (BENADRYL) 25 MG tablet Take 1 tablet (25 mg total) by mouth at bedtime as needed for up to 15 doses. 05/02/22  Yes Geana Walts, Carola Rhine, MD  ?ibuprofen (ADVIL) 600 MG tablet Take 1 tablet (600 mg total) by mouth every 6 (six) hours as needed for moderate pain or mild pain. 05/02/22 06/01/22 Yes Polo Mcmartin, Carola Rhine, MD  ?metoCLOPramide (REGLAN) 10 MG tablet Take 1 tablet (10 mg total) by mouth every 12 (twelve) hours as needed for up to 10 doses for nausea. 05/02/22  Yes Su Duma, Carola Rhine, MD  ?benzonatate (TESSALON) 100 MG capsule Take 1 capsule (100 mg total) by mouth 3 (three) times daily as needed for cough. 01/30/22   Camillia Herter, NP  ?diclofenac (VOLTAREN) 75 MG EC tablet Take 1 tablet (75 mg total) by mouth 2 (two) times daily. 02/21/22   Fransico Meadow, PA-C  ?etonogestrel (NEXPLANON) 68 MG IMPL implant 1 each by Subdermal route once. 07/05/21   [provider]  ?famotidine (PEPCID) 20 MG tablet Take 1 tablet (20 mg total) by mouth  2 (two) times daily. 02/04/22   Sherrill Raring, PA-C  ?gabapentin (NEURONTIN) 100 MG capsule Take 1 capsule (100 mg total) by mouth 3 (three) times daily. 08/13/21   Rosemarie Ax, MD  ?meclizine (ANTIVERT) 12.5 MG tablet Take 1 tablet (12.5 mg total) by mouth 3 (three) times daily as needed for dizziness. 01/30/22   Camillia Herter, NP  ?methocarbamol (ROBAXIN) 500 MG tablet Take 1 tablet (500 mg total) by mouth 4 (four) times daily. 02/21/22   Fransico Meadow, PA-C  ?Vitamin D, Ergocalciferol, (DRISDOL) 1.25 MG (50000 UNIT) CAPS capsule Take 1 capsule (50,000 Units total) by mouth every 7 (seven) days. 01/31/22   Camillia Herter, NP  ?   ? ?Allergies    ?Patient has no known allergies.   ? ?Review of Systems   ?Review of Systems ? ?Physical Exam ?Updated Vital Signs ?BP 115/84   Pulse 65   Temp 97.6 ?F (36.4 ?C) (Oral)   Resp 18   Ht 5\' 6"  (1.676 m)   Wt 81.6 kg   SpO2 100%   BMI 29.05 kg/m?  ?Physical Exam ?Constitutional:   ?   General: She is not in acute distress. ?HENT:  ?   Head: Normocephalic and atraumatic.  ?Eyes:  ?   Conjunctiva/sclera: Conjunctivae normal.  ?   Pupils: Pupils are  equal, round, and reactive to light.  ?Cardiovascular:  ?   Rate and Rhythm: Normal rate and regular rhythm.  ?Pulmonary:  ?   Effort: Pulmonary effort is normal. No respiratory distress.  ?Musculoskeletal:  ?   Cervical back: Normal range of motion and neck supple. No rigidity.  ?Skin: ?   General: Skin is warm and dry.  ?Neurological:  ?   General: No focal deficit present.  ?   Mental Status: She is alert and oriented to person, place, and time. Mental status is at baseline.  ?   Sensory: No sensory deficit.  ?   Motor: No weakness.  ?Psychiatric:     ?   Mood and Affect: Mood normal.     ?   Behavior: Behavior normal.  ? ? ?ED Results / Procedures / Treatments   ?Labs ?(all labs ordered are listed, but only abnormal results are displayed) ?Labs Reviewed  ?CBC - Abnormal; Notable for the following components:  ?    Result  Value  ? Hemoglobin 15.5 (*)   ? All other components within normal limits  ?BASIC METABOLIC PANEL  ?MAGNESIUM  ? ? ?EKG ?None ? ?Radiology ?No results found. ? ?Procedures ?Procedures  ? ? ?Medications Ordered in ED ?Medications  ?sodium chloride 0.9 % bolus 1,000 mL (0 mLs Intravenous Stopped 05/02/22 1349)  ?metoCLOPramide (REGLAN) injection 10 mg (10 mg Intravenous Given 05/02/22 1055)  ?ketorolac (TORADOL) 15 MG/ML injection 15 mg (15 mg Intravenous Given 05/02/22 1054)  ?magnesium sulfate IVPB 2 g 50 mL (0 g Intravenous Stopped 05/02/22 1156)  ?diphenhydrAMINE (BENADRYL) injection 25 mg (25 mg Intravenous Given 05/02/22 1054)  ? ? ?ED Course/ Medical Decision Making/ A&P ?Clinical Course as of 05/03/22 0804  ?Thu May 02, 2022  ?1345 Migraine pain now 4/10 and patient wishes to go home.  Prescriptions were provided.  Neurology referral placed for migraines [MT]  ?  ?Clinical Course User Index ?[MT] Wyvonnia Dusky, MD  ? ?                        ?Medical Decision Making ?Amount and/or Complexity of Data Reviewed ?Labs: ordered. ? ?Risk ?OTC drugs. ?Prescription drug management. ? ? ?This patient presents to the Emergency Department with complaint of headache.  This involves an extensive number of treatment options, and is a complaint that carries with it a high risk of complications and morbidity.  The differential diagnosis for headache includes tension type headache vs occipital headache vs migraine vs sinusitis vs over ? ?Lower suspicion for subarachnoid hemorrhage or cranial bleed with gradual onset of her headache and typical migraine pattern.  I do not believe she needs emergent neuroimaging of her brain, particularly with a normal MRI performed several months ago, and have a low suspicion for meningitis at this time.  She is afebrile without nuchal rigidity after multiple days of symptoms.  I do not believe she needs an LP ? ?I ordered, reviewed, and interpreted labs, including BMP and CBC.  There were no  immediate, life-threatening emergencies found in this labwork.   ?I ordered medication for headache and/or nausea ?External records reviewed including MRI report from 2022 ? ?After the interventions stated above, I reevaluated the patient and found that the patient remained clinically stable. ? ?Based on the patient's clinical exam, vital signs, risk factors, and ED testing, I felt that the patient's overall risk of life-threatening emergency such as ICH, meningitis, intracranial mass or tumor was quite  low. ? ?I suspect this clinical presentation is most consistent with migraine, but explained to the patient that this evaluation was not a definitive diagnostic workup.  I discussed outpatient follow up with primary care provider, and provided specialist office number on the patient's discharge paper if a referral was deemed necessary.  I discussed return precautions with the patient. I felt the patient was clinically stable for discharge. ? ? ? ? ? ? ? ? ?Final Clinical Impression(s) / ED Diagnoses ?Final diagnoses:  ?Other migraine without status migrainosus, not intractable  ? ? ?Rx / DC Orders ?ED Discharge Orders   ? ?      Ordered  ?  metoCLOPramide (REGLAN) 10 MG tablet  Every 12 hours PRN       ? 05/02/22 1347  ?  ibuprofen (ADVIL) 600 MG tablet  Every 6 hours PRN       ? 05/02/22 1347  ?  diphenhydrAMINE (BENADRYL) 25 MG tablet  At bedtime PRN       ? 05/02/22 1347  ?  Ambulatory referral to Neurology       ?Comments: An appointment is requested in approximately: 1 week ?Complex migraine  ? 05/02/22 1347  ? ?  ?  ? ?  ? ? ?  ?Wyvonnia Dusky, MD ?05/03/22 [phone] ? ?

## 2022-05-05 ENCOUNTER — Ambulatory Visit (INDEPENDENT_AMBULATORY_CARE_PROVIDER_SITE_OTHER): Payer: Managed Care, Other (non HMO)

## 2022-05-05 DIAGNOSIS — G540 Brachial plexus disorders: Secondary | ICD-10-CM | POA: Diagnosis not present

## 2022-05-05 IMAGING — MR MR [PERSON_NAME] UP W/O CM*L*
6 series · 40 of 40 positions shown · non-contrast
Comparison: None Available.

CLINICAL DATA: Thoracic outlet syndrome, injured [DATE].

EXAM:
MRI BRACHIAL PLEXUS WITHOUT CONTRAST
TECHNIQUE: Multiplanar, multiecho pulse sequences of the neck and surrounding
structures were obtained without intravenous contrast. The field of
view was focused on the leftbrachial plexus from the neural foramina
to the axilla.

[Series 3: T1 · axial · 5.0mm · 0.84mm/px · z∈[-5,+193]mm · 6 of 34 slices shown (1 of 3)]
[im 1/34]
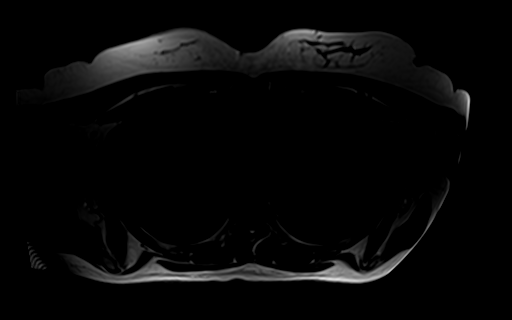
[im 7/34]
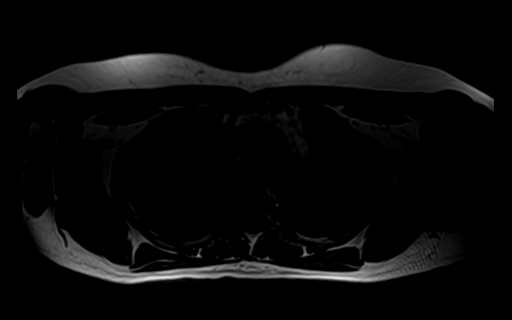
[im 14/34]
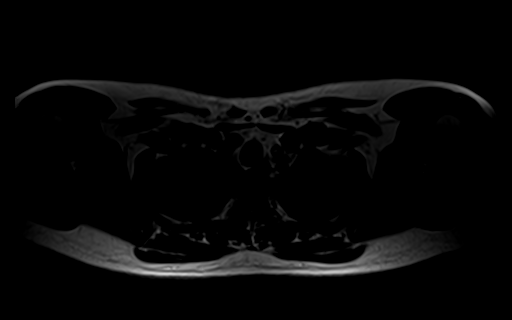
[im 20/34]
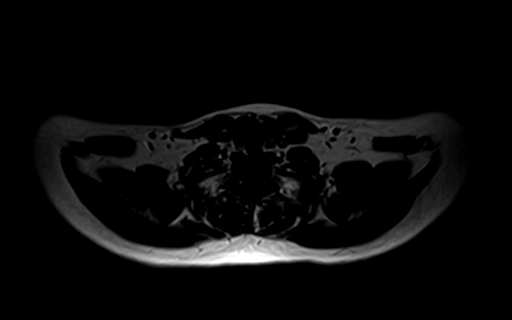
[im 27/34]
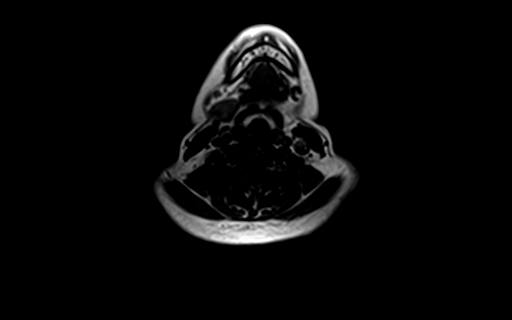
[im 34/34]
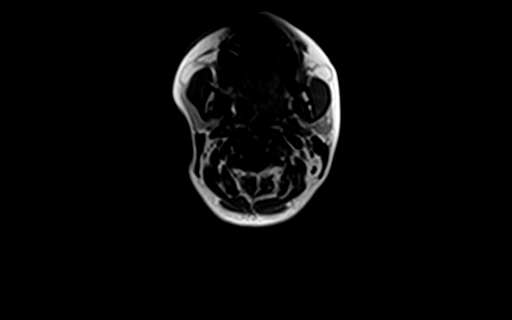

[Series 4: T2 fat-sat · axial · 5.0mm · 1.68mm/px · z∈[-5,+193]mm · 6 of 34 slices shown]
[im 1/34]
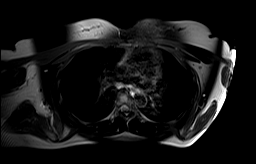
[im 7/34]
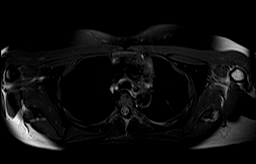
[im 14/34]
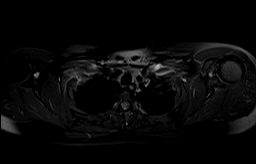
[im 20/34]
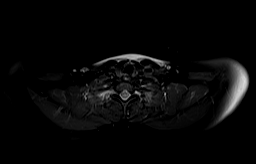
[im 27/34]
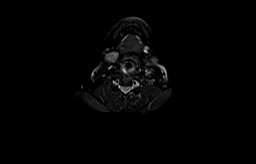
[im 34/34]
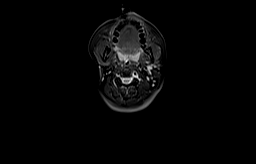

[Series 5: T1 · coronal · 4.0mm · 0.47mm/px · 6 of 28 slices shown (2 of 3)]
[im 1/28]
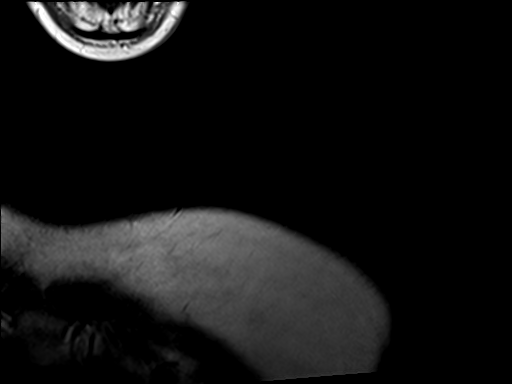
[im 6/28]
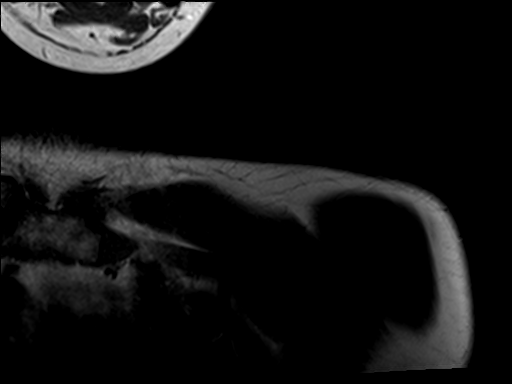
[im 11/28]
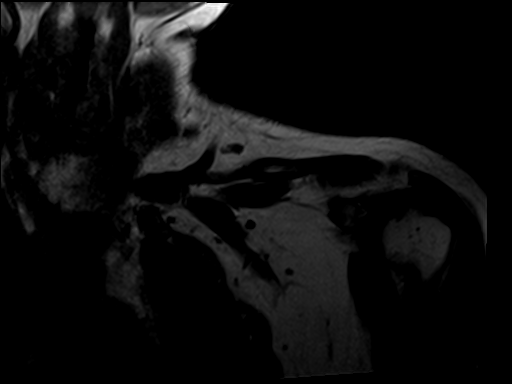
[im 17/28]
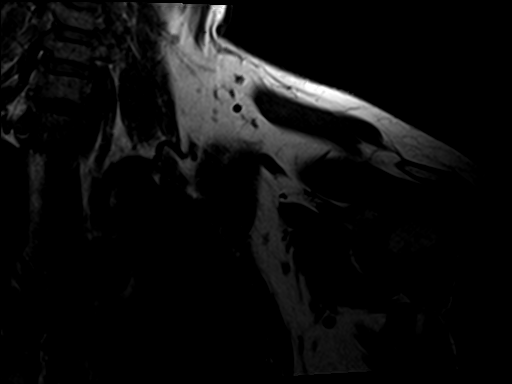
[im 22/28]
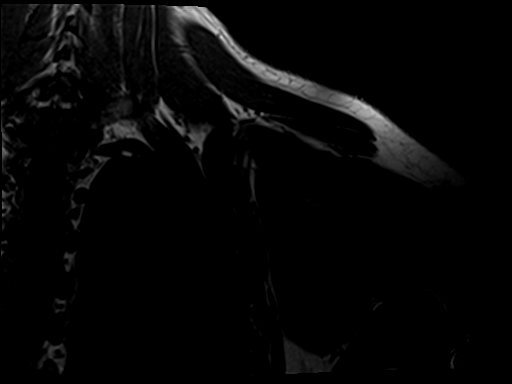
[im 28/28]
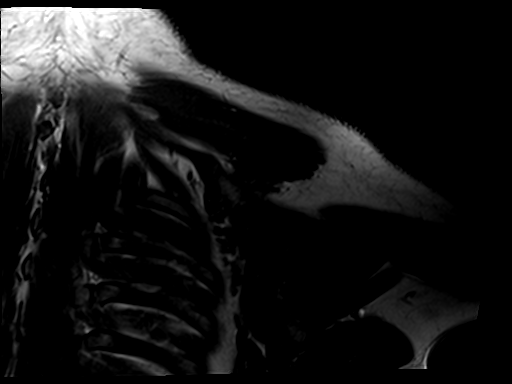

[Series 6: STIR · coronal · 4.0mm · 0.94mm/px · 6 of 28 slices shown]
[im 1/28]
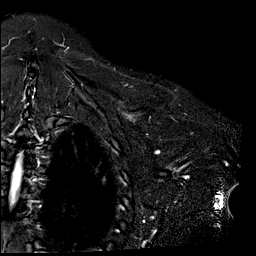
[im 6/28]
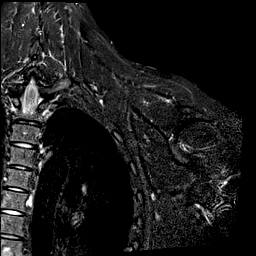
[im 11/28]
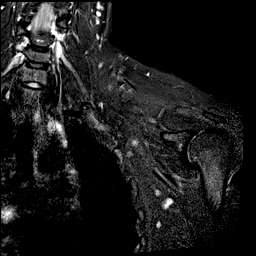
[im 17/28]
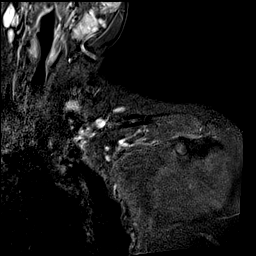
[im 22/28]
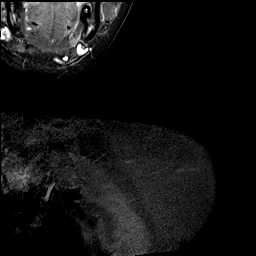
[im 28/28]
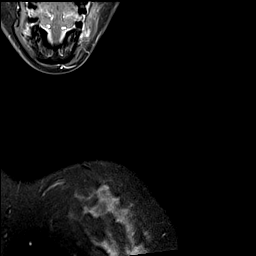

[Series 7: T1 · sagittal · 4.0mm · 0.75mm/px · 8 of 39 slices shown (3 of 3)]
[im 1/39]
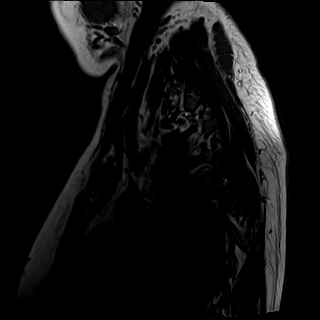
[im 6/39]
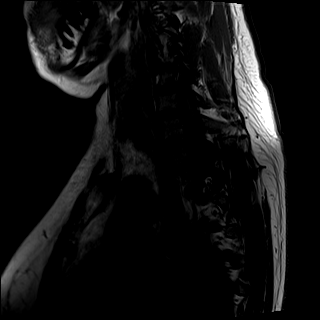
[im 11/39]
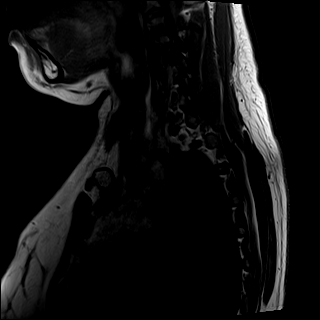
[im 17/39]
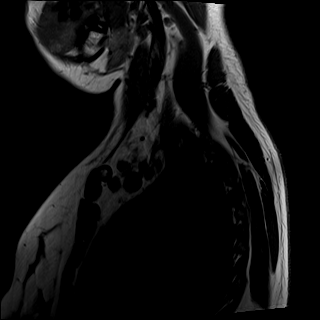
[im 22/39]
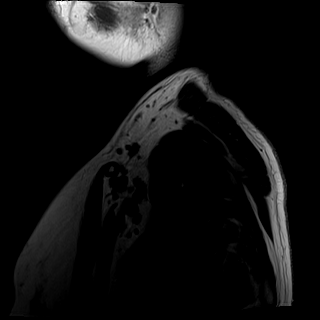
[im 28/39]
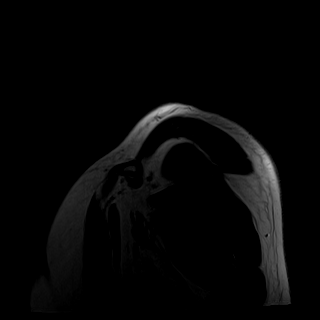
[im 33/39]
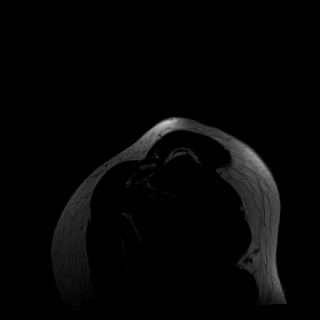
[im 39/39]
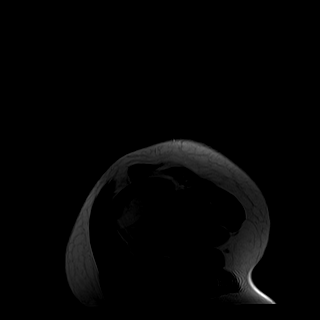

[Series 8: T2 · sagittal · 4.0mm · 0.94mm/px · 8 of 39 slices shown]
[im 1/39]
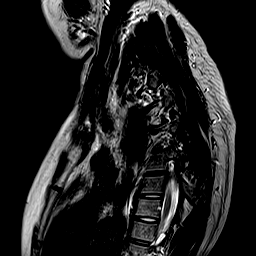
[im 6/39]
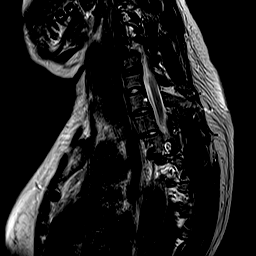
[im 11/39]
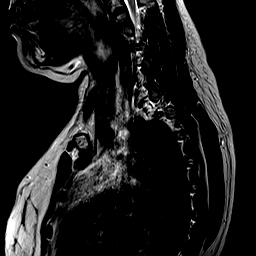
[im 17/39]
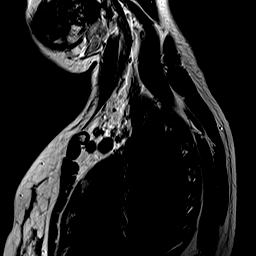
[im 22/39]
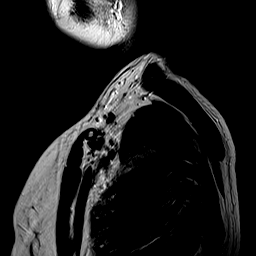
[im 28/39]
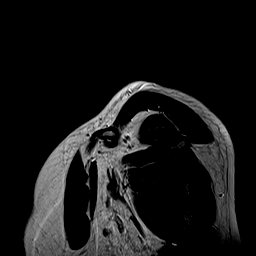
[im 33/39]
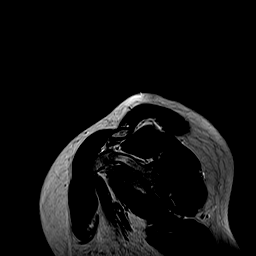
[im 39/39]
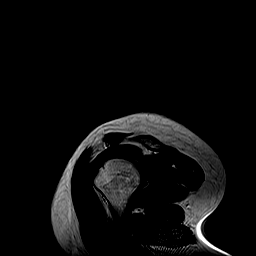

[40 of 40 positions shown; findings below may reference images not displayed]

FINDINGS: Spinal cord

Normal caliber and signal of visualized spinal cord.

Brachial plexus

Roots: Normal.

Trunks: Normal.

Divisions: Normal.

Cords: Normal.

Branches: Normal.

Muscles and tendons

Supraspinatus tendon is intact. Infraspinatus tendon is intact.
Teres minor tendon is intact. Subscapularis tendon is intact.
Muscles are normal in signal without atrophy or muscle edema.

Bones

Cervical spine: None.

C7 transverse processes: Normal.

Marrow signal: Normal.

Other findings

None
IMPRESSION: 1. Normal MRI of the left brachial plexus.
2. If there is clinical concern regarding vascular compromise,
recommend a CTA of the chest. If there is clinical concern regarding
thoracic outlet syndrome interventional radiology or vascular
surgery consultation may be helpful.

## 2022-05-08 ENCOUNTER — Encounter: Payer: Self-pay | Admitting: Diagnostic Neuroimaging

## 2022-05-08 ENCOUNTER — Ambulatory Visit: Payer: Managed Care, Other (non HMO) | Admitting: Diagnostic Neuroimaging

## 2022-05-08 VITALS — BP 114/77 | HR 64 | Ht 66.0 in | Wt 176.0 lb

## 2022-05-08 DIAGNOSIS — G43109 Migraine with aura, not intractable, without status migrainosus: Secondary | ICD-10-CM

## 2022-05-08 MED ORDER — RIZATRIPTAN BENZOATE 10 MG PO TBDP: 10.0000 mg | ORAL_TABLET | ORAL | 11 refills | Status: DC | PRN

## 2022-05-08 MED ORDER — TOPIRAMATE 50 MG PO TABS: 50.0000 mg | ORAL_TABLET | Freq: Two times a day (BID) | ORAL | 12 refills | Status: DC
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
## 2022-05-08 NOTE — Patient Instructions (Signed)
MIGRAINE PREVENTION  LIFESTYLE CHANGES -Stop or avoid smoking -Decrease or avoid caffeine / alcohol -Eat and sleep on a regular schedule -Exercise several times per week - start topiramate 50mg at bedtime; after 1-2 weeks increase to 50mg twice a day; drink plenty of water   MIGRAINE RESCUE  - ibuprofen, tylenol as needed - rizatriptan (Maxalt) 10mg as needed for breakthrough headache; may repeat x 1 after 2 hours; max 2 tabs per day or 8 per month 

## 2022-05-09 ENCOUNTER — Telehealth (INDEPENDENT_AMBULATORY_CARE_PROVIDER_SITE_OTHER): Payer: Managed Care, Other (non HMO) | Admitting: Family Medicine

## 2022-05-09 ENCOUNTER — Encounter: Payer: Self-pay | Admitting: Family Medicine

## 2022-05-09 DIAGNOSIS — G540 Brachial plexus disorders: Secondary | ICD-10-CM

## 2022-05-09 NOTE — Assessment & Plan Note (Signed)
Symptoms occurred intensely recently.  Most the time she is pain-free.  MRI of the left brachial plexus was normal as well as a left arm EMG.  Ultrasound of the left shoulder has been reassuring. ?-Counseled on home exercise therapy and supportive care. ?-Referral to vascular surgery. ?

## 2022-05-09 NOTE — Progress Notes (Signed)
Virtual Visit via Video Note ? ?I connected with Barbara Mcfarland on 05/09/22 at  8:30 AM EDT by a video enabled telemedicine application and verified that I am speaking with the correct person using two identifiers. ? ?Location: ?Patient: work ?Provider: office ?  ?I discussed the limitations of evaluation and management by telemedicine and the availability of in person appointments. The patient expressed understanding and agreed to proceed. ? ?History of Present Illness: ? ?Ms. Barbara Mcfarland is a 31 year old female that is following up after the MRI of the brachial plexus on the left.  This was a normal exam.  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  Is pain-free for most of the time. ?  ?Observations/Objective: ? ? ?Assessment and Plan: ? ?Left-sided brachial plexopathy: ?Symptoms occurred intensely recently.  Most the time she is pain-free.  MRI of the left brachial plexus was normal as well as a left arm EMG.  Ultrasound of the left shoulder has been reassuring. ?-Counseled on home exercise therapy and supportive care. ?-Referral to vascular surgery. ? ?Follow Up Instructions: ? ?  ?I discussed the assessment and treatment plan with the patient. The patient was provided an opportunity to ask questions and all were answered. The patient agreed with the plan and demonstrated an understanding of the instructions. ?  ?The patient was advised to call back or seek an in-person evaluation if the symptoms worsen or if the condition fails to improve as anticipated. ? ? ? ?Clearance Coots, MD ? ? ?

## 2022-05-12 NOTE — Progress Notes (Signed)
No show

## 2022-05-15 ENCOUNTER — Encounter (HOSPITAL_COMMUNITY): Payer: Self-pay

## 2022-05-15 ENCOUNTER — Other Ambulatory Visit: Payer: Self-pay

## 2022-05-15 ENCOUNTER — Emergency Department (HOSPITAL_COMMUNITY)
Admission: EM | Admit: 2022-05-15 | Discharge: 2022-05-16 | Disposition: A | Discharge status: Home / Self Care | Payer: Managed Care, Other (non HMO) | Attending: Emergency Medicine | Admitting: Emergency Medicine

## 2022-05-15 DIAGNOSIS — H579 Unspecified disorder of eye and adnexa: Secondary | ICD-10-CM | POA: Diagnosis present

## 2022-05-15 DIAGNOSIS — H1031 Unspecified acute conjunctivitis, right eye: Secondary | ICD-10-CM | POA: Insufficient documentation

## 2022-05-15 NOTE — ED Triage Notes (Signed)
Pt reports with right eye redness and itching since yesterday. Pt states that she was at the beach prior and felt like some sand may have gotten in it.  ?

## 2022-05-16 MED ORDER — FLUORESCEIN SODIUM 1 MG OP STRP
ORAL_STRIP | OPHTHALMIC | Status: AC
  Filled 2022-05-16: qty 1

## 2022-05-16 MED ORDER — TETRACAINE HCL 0.5 % OP SOLN
1.0000 [drp] | Freq: Once | OPHTHALMIC | Status: AC
Start: 2022-05-16 — End: 2022-05-16
  Administered 2022-05-16: 1 [drp] via OPHTHALMIC
  Filled 2022-05-16: qty 4

## 2022-05-16 MED ORDER — ERYTHROMYCIN 5 MG/GM OP OINT: TOPICAL_OINTMENT | Freq: Four times a day (QID) | OPHTHALMIC | 0 refills | Status: AC

## 2022-05-16 NOTE — ED Provider Notes (Signed)
[facility]-EMERGENCY DEPT  Provider Note  CSN: [id number] Arrival date & time: 05/15/22 2207  History Chief Complaint  Patient presents with   Eye Problem    Barbara Mcfarland is a 31 y.o. female with no significant PMH reports R eye redness and FB sensation since she was at the beach yesterday. She is concerned she may have gotten some sand in her eye. She has had some clear drainage. No crusting.    Home Medications Prior to Admission medications   Medication Sig Start Date End Date Taking? Authorizing Provider  erythromycin ophthalmic ointment Place into the right eye 4 (four) times daily for 5 days. Place a 1/2 inch ribbon of ointment into the lower eyelid. 05/16/22 05/21/22 Yes Pollyann Savoy, MD  benzonatate (TESSALON) 100 MG capsule Take 1 capsule (100 mg total) by mouth 3 (three) times daily as needed for cough. 01/30/22   Rema Fendt, NP  diclofenac (VOLTAREN) 75 MG EC tablet Take 1 tablet (75 mg total) by mouth 2 (two) times daily. 02/21/22   Elson Areas, PA-C  diphenhydrAMINE (BENADRYL) 25 MG tablet Take 1 tablet (25 mg total) by mouth at bedtime as needed for up to 15 doses. Patient not taking: Reported on 05/08/2022 05/02/22   Terald Sleeper, MD  etonogestrel (NEXPLANON) 68 MG IMPL implant 1 each by Subdermal route once. 07/05/21   [provider]  famotidine (PEPCID) 20 MG tablet Take 1 tablet (20 mg total) by mouth 2 (two) times daily. 02/04/22   Theron Arista, PA-C  gabapentin (NEURONTIN) 100 MG capsule Take 1 capsule (100 mg total) by mouth 3 (three) times daily. 08/13/21   Myra Rude, MD  ibuprofen (ADVIL) 600 MG tablet Take 1 tablet (600 mg total) by mouth every 6 (six) hours as needed for moderate pain or mild pain. Patient not taking: Reported on 05/08/2022 05/02/22 06/01/22  Terald Sleeper, MD  meclizine (ANTIVERT) 12.5 MG tablet Take 1 tablet (12.5 mg total) by mouth 3 (three) times daily as needed for dizziness. 01/30/22   Rema Fendt, NP  methocarbamol (ROBAXIN) 500 MG tablet Take 1 tablet (500 mg total) by mouth 4 (four) times daily. 02/21/22   Elson Areas, PA-C  metoCLOPramide (REGLAN) 10 MG tablet Take 1 tablet (10 mg total) by mouth every 12 (twelve) hours as needed for up to 10 doses for nausea. Patient not taking: Reported on 05/08/2022 05/02/22   Terald Sleeper, MD  rizatriptan (MAXALT-MLT) 10 MG disintegrating tablet Take 1 tablet (10 mg total) by mouth as needed for migraine. May repeat in 2 hours if needed 05/08/22   Penumalli, Glenford Bayley, MD  topiramate (TOPAMAX) 50 MG tablet Take 1 tablet (50 mg total) by mouth 2 (two) times daily. 05/08/22   Penumalli, Glenford Bayley, MD  Vitamin D, Ergocalciferol, (DRISDOL) 1.25 MG (50000 UNIT) CAPS capsule Take 1 capsule (50,000 Units total) by mouth every 7 (seven) days. 01/31/22   Rema Fendt, NP     Allergies    Patient has no known allergies.   Review of Systems   Review of Systems Please see HPI for pertinent positives and negatives  Physical Exam BP (!) 137/91   Pulse 71   Temp 98.7 F (37.1 C) (Oral)   Resp 18   Ht 5\' 6"  (1.676 m)   Wt 83.5 kg   SpO2 96%   BMI 29.70 kg/m   Physical Exam Vitals and nursing note reviewed.  HENT:  Head: Normocephalic.     Nose: Nose normal.  Eyes:     Extraocular Movements: Extraocular movements intact.     Pupils: Pupils are equal, round, and reactive to light.     Comments: R conjunctival injection, no FB, no corneal abrasion or ulcer on fluorescein stain  Pulmonary:     Effort: Pulmonary effort is normal.  Musculoskeletal:        General: Normal range of motion.     Cervical back: Neck supple.  Skin:    Findings: No rash (on exposed skin).  Neurological:     Mental Status: She is alert and oriented to person, place, and time.  Psychiatric:        Mood and Affect: Mood normal.    ED Results / Procedures / Treatments   EKG None  Procedures Procedures  Medications Ordered in the ED Medications   fluorescein 1 MG ophthalmic strip (  Given by Other 05/16/22 0309)  tetracaine (PONTOCAINE) 0.5 % ophthalmic solution 1 drop (1 drop Right Eye Given by Other 05/16/22 0309)    Initial Impression and Plan  Patient with exam consistent with conjunctivitis. No FB or corneal abrasion/ulcer. Will d/c with Rx for erythromycin ointment and Ophtho follow up if not improving.   ED Course       MDM Rules/Calculators/A&P Medical Decision Making Problems Addressed: Acute bacterial conjunctivitis of right eye: acute illness or injury  Risk Prescription drug management.    Final Clinical Impression(s) / ED Diagnoses Final diagnoses:  Acute bacterial conjunctivitis of right eye    Rx / DC Orders ED Discharge Orders          Ordered    erythromycin ophthalmic ointment  4 times daily        05/16/22 0317             Pollyann Savoy, MD 05/16/22 [phone]

## 2022-05-16 NOTE — ED Notes (Signed)
I provided reinforced discharge education based off of discharge instructions. Pt acknowledged and understood my education. Pt had no further questions/concerns for provider/myself.  °

## 2022-05-22 NOTE — Progress Notes (Signed)
Patient ID: Barbara Mcfarland, female    DOB: 06-05-1991  MRN: [id number]  CC: Annual Physical Exam  Subjective: Barbara Mcfarland is a 31 y.o. female who presents for annual physical exam.   Her concerns today include:  - Reports Nexplanon in left upper arm since June/July 20222. Placed at the [facility] Department. Endorses heavy flow periods. Describes in the past she was told that she has a sack that fills during her periods. Reports was told this is what causes heavy periods. She would like to know if this has worsened. Requesting referral to Gynecology.  - Reports 3 months chronic low back pain secondary to work-related incident. She was seen at the local emergency department. Xrays completed and all normal. Prescribed muscle relaxant which helped. Requesting refills. She is not ready for referral to Orthopedics as of present. Reports will update PCP if she decides to do so in the future. - Reports recent pink eye, located at the right. Eventually spread to the left. Prescribed medication and helped. She is due for an eye exam but not ready for referral as of present. Reports will update PCP if she decides to do so in the future.  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 20 tablet 0   diphenhydrAMINE (BENADRYL) 25 MG tablet Take 1 tablet (25 mg total) by mouth at bedtime as needed for up to 15 doses. (Patient not taking: Reported on 05/08/2022) 15 tablet 0    etonogestrel (NEXPLANON) 68 MG IMPL implant 1 each by Subdermal route once.     famotidine (PEPCID) 20 MG tablet Take 1 tablet (20 mg total) by mouth 2 (two) times daily. 30 tablet 0   gabapentin (NEURONTIN) 100 MG capsule Take 1 capsule (100 mg total) by mouth 3 (three) times daily. 60 capsule 1   ibuprofen (ADVIL) 600 MG tablet Take 1 tablet (600 mg total) by mouth every 6 (six) hours as needed for moderate pain or mild pain. (Patient not taking: Reported on 05/08/2022) 30 tablet 0   meclizine (ANTIVERT) 12.5 MG tablet Take 1 tablet (12.5 mg total) by mouth 3 (three) times daily as needed for dizziness. 30 tablet 0   metoCLOPramide (REGLAN) 10 MG tablet Take 1 tablet (10 mg total) by mouth every 12 (twelve) hours as needed for up to 10 doses for nausea. (Patient not taking: Reported on 05/08/2022) 10 tablet 0   rizatriptan (MAXALT-MLT) 10 MG disintegrating tablet Take 1 tablet (10 mg total) by mouth as needed for migraine. May repeat in 2 hours if needed 9 tablet 11   topiramate (TOPAMAX) 50 MG tablet Take 1 tablet (50 mg total) by mouth 2 (two) times daily. 60 tablet 12   Vitamin D, Ergocalciferol, (DRISDOL) 1.25 MG (50000 UNIT) CAPS capsule Take 1 capsule (50,000 Units total) by mouth every 7 (seven) days. 4 capsule 3   No current facility-administered medications on file prior to visit.    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  Tobacco Use   Smoking status: Never    Passive exposure: Never   Smokeless tobacco: Never  Vaping Use   Vaping Use: Never used  Substance and Sexual Activity   Alcohol use: No    Alcohol/week: 0.0 standard drinks   Drug use: Not Currently    Types: Marijuana   Sexual activity: Yes    Birth control/protection: Implant   Other Topics Concern   Not on file  Social History Narrative   Right handed    Caffeine- 1 cup per day    Social Determinants of Health   Financial Resource Strain: Not on file  Food Insecurity: Not on file  Transportation Needs: Not on file  Physical Activity: Not on file  Stress: Not on file  Social Connections: Not on file  Intimate Partner Violence: Not on file    Family History  Problem Relation Age of Onset   Healthy Mother    Healthy Father    Asthma Neg Hx    Cancer Neg Hx    Diabetes Neg Hx    Hypertension Neg Hx     Past Surgical History:  Procedure Laterality Date   NO PAST SURGERIES      ROS: Review of Systems Negative except as stated above  PHYSICAL EXAM: BP 112/77 (BP Location: Left Arm, Patient Position: Sitting, Cuff Size: Large)   Pulse 64   Temp 98.3 F (36.8 C)   Resp 18   Ht 5' 5.98" (1.676 m)   Wt 178 lb (80.7 kg)   SpO2 98%   BMI 28.74 kg/m   Physical Exam HENT:     Head: Normocephalic and atraumatic.     Right Ear: Tympanic membrane, ear canal and external ear normal.     Left Ear: Tympanic membrane, ear canal and external ear normal.     Nose: Nose normal.     Mouth/Throat:     Mouth: Mucous membranes are moist.     Pharynx: Oropharynx is clear.  Eyes:     Extraocular Movements: Extraocular movements intact.     Conjunctiva/sclera: Conjunctivae normal.     Pupils: Pupils are equal, round, and reactive to light.  Cardiovascular:     Rate and Rhythm: Normal rate and regular rhythm.     Pulses: Normal pulses.     Heart sounds: Normal heart sounds.  Pulmonary:     Effort: Pulmonary effort is normal.     Breath sounds: Normal breath sounds.  Chest:     Comments: Patient declined.  Abdominal:     General: Bowel sounds are normal.     Palpations: Abdomen is soft.  Genitourinary:    Comments: Patient declined. Musculoskeletal:        General: Normal range of motion.     Cervical back: Normal range of motion and neck  supple.  Skin:    General: Skin is warm and dry.     Capillary Refill: Capillary refill takes less than 2 seconds.  Neurological:     General: No focal deficit present.     Mental Status: She is alert and oriented to person, place, and time.  Psychiatric:        Mood  and Affect: Mood normal.        Behavior: Behavior normal.   ASSESSMENT AND PLAN: 1. Annual physical exam: - Counseled on 150 minutes of exercise per week as tolerated, healthy eating (including decreased daily intake of saturated fats, cholesterol, added sugars, sodium), STI prevention, and routine healthcare maintenance.  2. Screening for metabolic disorder: - Screening liver function. - Hepatic Function Panel  3. Diabetes mellitus screening: - Hemoglobin A1c to screen for pre-diabetes/diabetes. - Hemoglobin A1c  4. Screening cholesterol level: - Lipid panel to screen for high cholesterol.  - Lipid panel  5. Need for hepatitis C screening test: - Hepatitis C antibody to screen for hepatitis C.  - Hepatitis C Antibody  6. Vitamin D deficiency: - Screening for deficiency. - Vitamin D, 25-hydroxy  7. Nexplanon in place: 8. Menorrhagia with regular cycle: - Referral to Gynecology for further evaluation and management.  - Ambulatory referral to Gynecology  9. Chronic bilateral low back pain, unspecified whether sciatica present: - Methocarbamol as prescribed. Counseled on medication adherence and adverse effects. - Patient declined referral to Orthopedics as of present. Aware to update PCP if elects referral in the future.  - methocarbamol (ROBAXIN) 500 MG tablet; Take 1 tablet (500 mg total) by mouth every 8 (eight) hours as needed for muscle spasms.  Dispense: 20 tablet; Refill: 1    Patient was given the opportunity to ask questions.  Patient verbalized understanding of the plan and was able to repeat key elements of the plan. Patient was given clear instructions to go to Emergency Department or return to  medical center if symptoms don't improve, worsen, or new problems develop.The patient verbalized understanding.   Orders Placed This Encounter  Procedures   Hepatic Function Panel   Hemoglobin A1c   Lipid panel   Hepatitis C Antibody   Vitamin D, 25-hydroxy   Ambulatory referral to Gynecology     Requested Prescriptions   Signed Prescriptions Disp Refills   methocarbamol (ROBAXIN) 500 MG tablet 20 tablet 1    Sig: Take 1 tablet (500 mg total) by mouth every 8 (eight) hours as needed for muscle spasms.    Return in about 1 year (around 05/30/2023) for Follow-Up or next available.  Camillia Herter, NP

## 2022-05-29 ENCOUNTER — Encounter: Payer: Self-pay | Admitting: Family

## 2022-05-29 ENCOUNTER — Ambulatory Visit (INDEPENDENT_AMBULATORY_CARE_PROVIDER_SITE_OTHER): Payer: Managed Care, Other (non HMO) | Admitting: Family

## 2022-05-29 VITALS — BP 112/77 | HR 64 | Temp 98.3°F | Resp 18 | Ht 65.98 in | Wt 178.0 lb

## 2022-05-29 DIAGNOSIS — Z131 Encounter for screening for diabetes mellitus: Secondary | ICD-10-CM | POA: Diagnosis not present

## 2022-05-29 DIAGNOSIS — G8929 Other chronic pain: Secondary | ICD-10-CM

## 2022-05-29 DIAGNOSIS — Z13228 Encounter for screening for other metabolic disorders: Secondary | ICD-10-CM

## 2022-05-29 DIAGNOSIS — Z0000 Encounter for general adult medical examination without abnormal findings: Secondary | ICD-10-CM | POA: Diagnosis not present

## 2022-05-29 DIAGNOSIS — E559 Vitamin D deficiency, unspecified: Secondary | ICD-10-CM

## 2022-05-29 DIAGNOSIS — Z13220 Encounter for screening for lipoid disorders: Secondary | ICD-10-CM | POA: Diagnosis not present

## 2022-05-29 DIAGNOSIS — M545 Low back pain, unspecified: Secondary | ICD-10-CM

## 2022-05-29 DIAGNOSIS — Z1159 Encounter for screening for other viral diseases: Secondary | ICD-10-CM

## 2022-05-29 DIAGNOSIS — N920 Excessive and frequent menstruation with regular cycle: Secondary | ICD-10-CM

## 2022-05-29 DIAGNOSIS — Z975 Presence of (intrauterine) contraceptive device: Secondary | ICD-10-CM

## 2022-05-29 MED ORDER — METHOCARBAMOL 500 MG PO TABS: 500.0000 mg | ORAL_TABLET | Freq: Three times a day (TID) | ORAL | 1 refills | Status: DC | PRN

## 2022-05-29 NOTE — Progress Notes (Signed)
Pt presents for annual physical exam, pt request referral to gynecology due to Nexaplon implant and abnormal bleeding during intercourse unable to have intercourse due to always start to bleed

## 2022-05-29 NOTE — Patient Instructions (Signed)

## 2022-05-30 ENCOUNTER — Other Ambulatory Visit: Payer: Self-pay | Admitting: Family

## 2022-05-30 DIAGNOSIS — E559 Vitamin D deficiency, unspecified: Secondary | ICD-10-CM

## 2022-05-30 LAB — HEPATITIS C ANTIBODY: Hep C Virus Ab: NONREACTIVE

## 2022-05-30 LAB — LIPID PANEL
Chol/HDL Ratio: 3.4 ratio (ref 0.0–4.4)
Cholesterol, Total: 138 mg/dL (ref 100–199)
HDL: 41 mg/dL (ref >39)
LDL Chol Calc (NIH): 78 mg/dL (ref 0–99)
Triglycerides: 105 mg/dL (ref 0–149)
VLDL Cholesterol Cal: 19 mg/dL (ref 5–40)

## 2022-05-30 LAB — HEPATIC FUNCTION PANEL
ALT: 22 IU/L (ref 0–32)
AST: 14 IU/L (ref 0–40)
Albumin: 4.7 g/dL (ref 3.9–5.0)
Alkaline Phosphatase: 103 IU/L (ref 44–121)
Bilirubin Total: 0.6 mg/dL (ref 0.0–1.2)
Bilirubin, Direct: 0.12 mg/dL (ref 0.00–0.40)
Total Protein: 7.7 g/dL (ref 6.0–8.5)

## 2022-05-30 LAB — HEMOGLOBIN A1C
Est. average glucose Bld gHb Est-mCnc: 100 mg/dL
Hgb A1c MFr Bld: 5.1 % (ref 4.8–5.6)

## 2022-05-30 LAB — VITAMIN D 25 HYDROXY (VIT D DEFICIENCY, FRACTURES): Vit D, 25-Hydroxy: 20.8 ng/mL — ABNORMAL LOW (ref 30.0–100.0)

## 2022-05-30 MED ORDER — VITAMIN D (ERGOCALCIFEROL) 1.25 MG (50000 UNIT) PO CAPS: 50000.0000 [IU] | ORAL_CAPSULE | ORAL | 2 refills | Status: AC

## 2022-05-30 NOTE — Progress Notes (Signed)
-   No diabetes.  - Cholesterol normal.  - Liver function normal. - Hepatitis C negative.   The following abnormalities are noted:   - Vitamin D lower than normal.   All other values are normal, stable or within acceptable limits.  Medication changes / Follow up labs / Other changes or recommendations:   - Vitamin D supplement prescribed. Recheck vitamin D levels in 12 weeks.   Rema Fendt, NP 05/30/2022 9:39 AM

## 2022-05-31 NOTE — Progress Notes (Signed)
Virtual Visit via Telephone Note  I connected with Barbara Mcfarland, on 06/03/2022 at 10:13 AM by telephone and verified that I am speaking with the correct person using two identifiers.  Consent: I discussed the limitations, risks, security and privacy concerns of performing an evaluation and management service by telephone and the availability of in person appointments. I also discussed with the patient that there may be a patient responsible charge related to this service. The patient expressed understanding and agreed to proceed.   Location of Patient: Home  Location of Provider: [facility]   Persons participating in Telemedicine visit: Jaleeyah Jacqualyn Posey, NP Margorie John, CMA   History of Present Illness: Barbara Mcfarland is a 31 year-old female who presents for FMLA completion related to migraines. She is established with Joycelyn Schmid, MD at [facility]. Reports when having migraines can last 7 days. Occurs once monthly. Employed at [employer].     Past Medical History:  Diagnosis Date   ASCUS with positive high risk HPV cervical pap smear at [facility] 04/09/2018   Migraine headache from childhood   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   Raynaud phenomenon 02/20/2016   No Known Allergies  Current Outpatient Medications on File Prior to Visit  Medication Sig Dispense Refill   diclofenac (VOLTAREN) 75 MG EC tablet Take 1 tablet (75 mg total) by mouth 2 (two) times daily. 20 tablet 0   diphenhydrAMINE (BENADRYL) 25 MG tablet Take 1 tablet (25 mg total) by mouth at bedtime as needed for up to 15 doses. (Patient not taking: Reported on 05/08/2022) 15 tablet 0   etonogestrel (NEXPLANON) 68 MG IMPL implant 1 each by Subdermal route once.     famotidine (PEPCID) 20 MG tablet Take 1 tablet (20 mg total) by mouth 2 (two) times daily. 30 tablet 0   gabapentin (NEURONTIN) 100 MG capsule Take 1 capsule (100 mg total) by  mouth 3 (three) times daily. 60 capsule 1   meclizine (ANTIVERT) 12.5 MG tablet Take 1 tablet (12.5 mg total) by mouth 3 (three) times daily as needed for dizziness. 30 tablet 0   methocarbamol (ROBAXIN) 500 MG tablet Take 1 tablet (500 mg total) by mouth every 8 (eight) hours as needed for muscle spasms. 20 tablet 1   metoCLOPramide (REGLAN) 10 MG tablet Take 1 tablet (10 mg total) by mouth every 12 (twelve) hours as needed for up to 10 doses for nausea. (Patient not taking: Reported on 05/08/2022) 10 tablet 0   rizatriptan (MAXALT-MLT) 10 MG disintegrating tablet Take 1 tablet (10 mg total) by mouth as needed for migraine. May repeat in 2 hours if needed 9 tablet 11   topiramate (TOPAMAX) 50 MG tablet Take 1 tablet (50 mg total) by mouth 2 (two) times daily. 60 tablet 12   Vitamin D, Ergocalciferol, (DRISDOL) 1.25 MG (50000 UNIT) CAPS capsule Take 1 capsule (50,000 Units total) by mouth every 7 (seven) days for 12 doses. 4 capsule 2   No current facility-administered medications on file prior to visit.    Observations/Objective: Alert and oriented x 3. Not in acute distress. Physical examination not completed as this is a telemedicine visit.  Assessment and Plan: 1. Encounter for completion of form with patient: 2. Migraine with aura and without status migrainosus, not intractable: - FMLA paperwork completed today in office.  - Keep all scheduled appointments with Joycelyn Schmid, MD at [facility].   Follow Up Instructions: Follow-up with  primary provider as scheduled.   Patient was given clear instructions to go to Emergency Department or return to medical center if symptoms don't improve, worsen, or new problems develop.The patient verbalized understanding.  I discussed the assessment and treatment plan with the patient. The patient was provided an opportunity to ask questions and all were answered. The patient agreed with the plan and demonstrated an understanding  of the instructions.   The patient was advised to call back or seek an in-person evaluation if the symptoms worsen or if the condition fails to improve as anticipated.    I provided 8 minutes total of non-face-to-face time during this encounter.   Rema Fendt, NP  [facility] [place], [place] [phone] 06/03/2022, 10:13 AM

## 2022-06-03 ENCOUNTER — Ambulatory Visit (INDEPENDENT_AMBULATORY_CARE_PROVIDER_SITE_OTHER): Payer: Managed Care, Other (non HMO) | Admitting: Family

## 2022-06-03 DIAGNOSIS — Z0289 Encounter for other administrative examinations: Secondary | ICD-10-CM | POA: Diagnosis not present

## 2022-06-03 DIAGNOSIS — G43109 Migraine with aura, not intractable, without status migrainosus: Secondary | ICD-10-CM | POA: Diagnosis not present

## 2022-06-14 ENCOUNTER — Other Ambulatory Visit: Payer: Self-pay | Admitting: *Deleted

## 2022-06-14 DIAGNOSIS — G540 Brachial plexus disorders: Secondary | ICD-10-CM

## 2022-06-24 ENCOUNTER — Ambulatory Visit (HOSPITAL_COMMUNITY)
Admission: RE | Admit: 2022-06-24 | Discharge: 2022-06-24 | Disposition: A | Discharge status: Home / Self Care | Payer: Managed Care, Other (non HMO) | Source: Ambulatory Visit | Attending: Vascular Surgery | Admitting: Vascular Surgery

## 2022-06-24 DIAGNOSIS — G540 Brachial plexus disorders: Secondary | ICD-10-CM | POA: Insufficient documentation

## 2022-06-26 ENCOUNTER — Ambulatory Visit (INDEPENDENT_AMBULATORY_CARE_PROVIDER_SITE_OTHER): Payer: Managed Care, Other (non HMO) | Admitting: Vascular Surgery

## 2022-06-26 ENCOUNTER — Encounter: Payer: Self-pay | Admitting: Vascular Surgery

## 2022-06-26 VITALS — BP 118/78 | HR 67 | Temp 98.6°F | Resp 20 | Ht 65.0 in | Wt 177.0 lb

## 2022-06-26 DIAGNOSIS — G540 Brachial plexus disorders: Secondary | ICD-10-CM | POA: Diagnosis not present
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
## 2022-07-08 ENCOUNTER — Encounter (HOSPITAL_COMMUNITY): Payer: Self-pay

## 2022-07-08 ENCOUNTER — Emergency Department (HOSPITAL_COMMUNITY): Payer: Managed Care, Other (non HMO)

## 2022-07-08 ENCOUNTER — Emergency Department (HOSPITAL_COMMUNITY)
Admission: EM | Admit: 2022-07-08 | Discharge: 2022-07-09 | Disposition: A | Discharge status: Home / Self Care | Payer: Managed Care, Other (non HMO) | Attending: Emergency Medicine | Admitting: Emergency Medicine

## 2022-07-08 DIAGNOSIS — K529 Noninfective gastroenteritis and colitis, unspecified: Secondary | ICD-10-CM | POA: Diagnosis not present

## 2022-07-08 DIAGNOSIS — D72829 Elevated white blood cell count, unspecified: Secondary | ICD-10-CM | POA: Diagnosis not present

## 2022-07-08 DIAGNOSIS — Q631 Lobulated, fused and horseshoe kidney: Secondary | ICD-10-CM | POA: Diagnosis not present

## 2022-07-08 DIAGNOSIS — R1013 Epigastric pain: Secondary | ICD-10-CM | POA: Diagnosis present

## 2022-07-08 DIAGNOSIS — R1084 Generalized abdominal pain: Secondary | ICD-10-CM

## 2022-07-08 LAB — URINALYSIS, ROUTINE W REFLEX MICROSCOPIC
Bilirubin Urine: NEGATIVE
Glucose, UA: NEGATIVE mg/dL
Hgb urine dipstick: NEGATIVE
Ketones, ur: NEGATIVE mg/dL
Leukocytes,Ua: NEGATIVE
Nitrite: NEGATIVE
Protein, ur: NEGATIVE mg/dL
Specific Gravity, Urine: 1.023 (ref 1.005–1.030)
pH: 5 (ref 5.0–8.0)

## 2022-07-08 LAB — CBC WITH DIFFERENTIAL/PLATELET
Abs Immature Granulocytes: 0.04 10*3/uL (ref 0.00–0.07)
Basophils Absolute: 0 10*3/uL (ref 0.0–0.1)
Basophils Relative: 0 %
Eosinophils Absolute: 0.1 10*3/uL (ref 0.0–0.5)
Eosinophils Relative: 1 %
HCT: 48.9 % — ABNORMAL HIGH (ref 36.0–46.0)
Hemoglobin: 16.7 g/dL — ABNORMAL HIGH (ref 12.0–15.0)
Immature Granulocytes: 0 %
Lymphocytes Relative: 10 %
Lymphs Abs: 1.4 10*3/uL (ref 0.7–4.0)
MCH: 31.2 pg (ref 26.0–34.0)
MCHC: 34.2 g/dL (ref 30.0–36.0)
MCV: 91.2 fL (ref 80.0–100.0)
Monocytes Absolute: 0.8 10*3/uL (ref 0.1–1.0)
Monocytes Relative: 5 %
Neutro Abs: 11.9 10*3/uL — ABNORMAL HIGH (ref 1.7–7.7)
Neutrophils Relative %: 84 %
Platelets: 238 10*3/uL (ref 150–400)
RBC: 5.36 MIL/uL — ABNORMAL HIGH (ref 3.87–5.11)
RDW: 12.1 % (ref 11.5–15.5)
WBC: 14.3 10*3/uL — ABNORMAL HIGH (ref 4.0–10.5)
nRBC: 0 % (ref 0.0–0.2)

## 2022-07-08 LAB — COMPREHENSIVE METABOLIC PANEL
ALT: 23 U/L (ref 0–44)
AST: 22 U/L (ref 15–41)
Albumin: 4.7 g/dL (ref 3.5–5.0)
Alkaline Phosphatase: 88 U/L (ref 38–126)
Anion gap: 8 (ref 5–15)
BUN: 13 mg/dL (ref 6–20)
CO2: 19 mmol/L — ABNORMAL LOW (ref 22–32)
Calcium: 9.6 mg/dL (ref 8.9–10.3)
Chloride: 109 mmol/L (ref 98–111)
Creatinine, Ser: 0.85 mg/dL (ref 0.44–1.00)
GFR, Estimated: >60 mL/min (ref >60)
Glucose, Bld: 97 mg/dL (ref 70–99)
Potassium: 4.1 mmol/L (ref 3.5–5.1)
Sodium: 136 mmol/L (ref 135–145)
Total Bilirubin: 0.7 mg/dL (ref 0.3–1.2)
Total Protein: 8.5 g/dL — ABNORMAL HIGH (ref 6.5–8.1)

## 2022-07-08 LAB — I-STAT BETA HCG BLOOD, ED (MC, WL, AP ONLY): I-stat hCG, quantitative: <5 m[IU]/mL (ref <5)

## 2022-07-08 LAB — LIPASE, BLOOD: Lipase: 29 U/L (ref 11–51)

## 2022-07-08 MED ORDER — ALUM & MAG HYDROXIDE-SIMETH 200-200-20 MG/5ML PO SUSP
30.0000 mL | Freq: Once | ORAL | Status: AC
  Administered 2022-07-08: 30 mL via ORAL
  Filled 2022-07-08: qty 30

## 2022-07-08 MED ORDER — MORPHINE SULFATE (PF) 4 MG/ML IV SOLN
4.0000 mg | Freq: Once | INTRAVENOUS | Status: AC
  Administered 2022-07-08: 4 mg via INTRAVENOUS
  Filled 2022-07-08: qty 1

## 2022-07-08 MED ORDER — OMEPRAZOLE 20 MG PO CPDR: 20.0000 mg | DELAYED_RELEASE_CAPSULE | Freq: Every day | ORAL | 0 refills | Status: AC

## 2022-07-08 MED ORDER — IOHEXOL 300 MG/ML  SOLN
100.0000 mL | Freq: Once | INTRAMUSCULAR | Status: AC | PRN
  Administered 2022-07-08: 100 mL via INTRAVENOUS

## 2022-07-08 MED ORDER — SODIUM CHLORIDE (PF) 0.9 % IJ SOLN
INTRAMUSCULAR | Status: AC
  Filled 2022-07-08: qty 50

## 2022-07-08 MED ORDER — ONDANSETRON HCL 4 MG/2ML IJ SOLN
4.0000 mg | Freq: Once | INTRAMUSCULAR | Status: AC
  Administered 2022-07-08: 4 mg via INTRAVENOUS
  Filled 2022-07-08: qty 2

## 2022-07-08 MED ORDER — LIDOCAINE VISCOUS HCL 2 % MT SOLN
15.0000 mL | Freq: Once | OROMUCOSAL | Status: AC
  Administered 2022-07-08: 15 mL via ORAL
  Filled 2022-07-08: qty 15

## 2022-07-08 MED ORDER — ONDANSETRON 4 MG PO TBDP: 4.0000 mg | ORAL_TABLET | Freq: Three times a day (TID) | ORAL | 0 refills | Status: DC | PRN

## 2022-07-08 MED ORDER — SODIUM CHLORIDE 0.9 % IV BOLUS
1000.0000 mL | Freq: Once | INTRAVENOUS | Status: AC
  Administered 2022-07-08: 1000 mL via INTRAVENOUS

## 2022-07-08 NOTE — ED Notes (Signed)
Pt transported to CT ?

## 2022-07-08 NOTE — Discharge Instructions (Addendum)
Zofran as needed as prescribed for nausea. Omeprazole daily. Follow up with your PCP this week for recheck. Follow up with urology for new diagnosis horseshoe kidney.

## 2022-07-08 NOTE — ED Provider Triage Note (Signed)
Emergency Medicine Provider Triage Evaluation Note  Barbara Mcfarland , a 31 y.o. female  was evaluated in triage.  Pt complains of sharp epigastric abdominal pain, onset this morning upon waking, n/v/d. Does not radiate, hasn't eaten. Hasn't taken anything for pain. No prior abdominal surgery, does not drink etoh.  No PMH. Denies possibility pregnancy.  Review of Systems  Positive: N/v/d, chills Negative: fever  Physical Exam  BP 137/89 (BP Location: Left Arm)   Pulse 68   Temp 98.3 F (36.8 C) (Oral)   Resp 20   SpO2 100%  Gen:   Awake, no distress   Resp:  Normal effort  MSK:   Moves extremities without difficulty  Other:    Medical Decision Making  Medically screening exam initiated at 3:13 PM.  Appropriate orders placed.  Barbara Mcfarland was informed that the remainder of the evaluation will be completed by another provider, this initial triage assessment does not replace that evaluation, and the importance of remaining in the ED until their evaluation is complete.     Jeannie Fend, PA-C 07/08/22 1514

## 2022-07-08 NOTE — ED Provider Notes (Signed)
[facility] Provider Note   CSN: [id number] Arrival date & time: 07/08/22  1419     History  Chief Complaint  Patient presents with   Abdominal Pain    Barbara Mcfarland is a 31 y.o. female.  Pt complains of sharp epigastric abdominal pain, onset this morning upon waking, n/v/d. Does not radiate, hasn't eaten. Hasn't taken anything for pain. No prior abdominal surgery, does not drink etoh.  No PMH. Denies possibility pregnancy.        Home Medications Prior to Admission medications   Medication Sig Start Date End Date Taking? Authorizing Provider  omeprazole (PRILOSEC) 20 MG capsule Take 1 capsule (20 mg total) by mouth daily. 07/08/22 08/07/22 Yes Jeannie Fend, PA-C  ondansetron (ZOFRAN-ODT) 4 MG disintegrating tablet Take 1 tablet (4 mg total) by mouth every 8 (eight) hours as needed for nausea or vomiting. 07/08/22  Yes Jeannie Fend, PA-C  diclofenac (VOLTAREN) 75 MG EC tablet Take 1 tablet (75 mg total) by mouth 2 (two) times daily. 02/21/22   Elson Areas, PA-C  diphenhydrAMINE (BENADRYL) 25 MG tablet Take 1 tablet (25 mg total) by mouth at bedtime as needed for up to 15 doses. Patient not taking: Reported on 05/08/2022 05/02/22   Terald Sleeper, MD  etonogestrel (NEXPLANON) 68 MG IMPL implant 1 each by Subdermal route once. 07/05/21   [provider]  famotidine (PEPCID) 20 MG tablet Take 1 tablet (20 mg total) by mouth 2 (two) times daily. 02/04/22   Theron Arista, PA-C  gabapentin (NEURONTIN) 100 MG capsule Take 1 capsule (100 mg total) by mouth 3 (three) times daily. 08/13/21   Myra Rude, MD  meclizine (ANTIVERT) 12.5 MG tablet Take 1 tablet (12.5 mg total) by mouth 3 (three) times daily as needed for dizziness. 01/30/22   Rema Fendt, NP  methocarbamol (ROBAXIN) 500 MG tablet Take 1 tablet (500 mg total) by mouth every 8 (eight) hours as needed for muscle spasms. 05/29/22   Rema Fendt, NP  metoCLOPramide (REGLAN)  10 MG tablet Take 1 tablet (10 mg total) by mouth every 12 (twelve) hours as needed for up to 10 doses for nausea. Patient not taking: Reported on 05/08/2022 05/02/22   Terald Sleeper, MD  rizatriptan (MAXALT-MLT) 10 MG disintegrating tablet Take 1 tablet (10 mg total) by mouth as needed for migraine. May repeat in 2 hours if needed 05/08/22   Penumalli, Glenford Bayley, MD  topiramate (TOPAMAX) 50 MG tablet Take 1 tablet (50 mg total) by mouth 2 (two) times daily. 05/08/22   Penumalli, Glenford Bayley, MD  Vitamin D, Ergocalciferol, (DRISDOL) 1.25 MG (50000 UNIT) CAPS capsule Take 1 capsule (50,000 Units total) by mouth every 7 (seven) days for 12 doses. 05/30/22 08/16/22  Rema Fendt, NP      Allergies    Patient has no known allergies.    Review of Systems   Review of Systems Negative except as per HPI Physical Exam Updated Vital Signs BP 102/81   Pulse 74   Temp 99 F (37.2 C) (Oral)   Resp 20   SpO2 100%  Physical Exam Vitals and nursing note reviewed.  Constitutional:      General: She is not in acute distress.    Appearance: She is well-developed. She is not diaphoretic.  HENT:     Head: Normocephalic and atraumatic.  Cardiovascular:     Rate and Rhythm: Normal rate and regular rhythm.  Heart sounds: Normal heart sounds.  Pulmonary:     Effort: Pulmonary effort is normal.     Breath sounds: Normal breath sounds.  Abdominal:     Palpations: Abdomen is soft.     Tenderness: There is abdominal tenderness in the right upper quadrant, epigastric area and left upper quadrant. There is no right CVA tenderness or left CVA tenderness.  Skin:    General: Skin is warm and dry.     Findings: No erythema or rash.  Neurological:     Mental Status: She is alert and oriented to person, place, and time.  Psychiatric:        Behavior: Behavior normal.     ED Results / Procedures / Treatments   Labs (all labs ordered are listed, but only abnormal results are displayed) Labs Reviewed  CBC  WITH DIFFERENTIAL/PLATELET - Abnormal; Notable for the following components:      Result Value   WBC 14.3 (*)    RBC 5.36 (*)    Hemoglobin 16.7 (*)    HCT 48.9 (*)    Neutro Abs 11.9 (*)    All other components within normal limits  COMPREHENSIVE METABOLIC PANEL - Abnormal; Notable for the following components:   CO2 19 (*)    Total Protein 8.5 (*)    All other components within normal limits  URINALYSIS, ROUTINE W REFLEX MICROSCOPIC - Abnormal; Notable for the following components:   APPearance HAZY (*)    All other components within normal limits  LIPASE, BLOOD  I-STAT BETA HCG BLOOD, ED ([facility], [facility], [facility] ONLY)  I-STAT BETA HCG BLOOD, ED ([facility], [facility], [facility] ONLY)    EKG None  Radiology CT Abdomen Pelvis W Contrast  Result Date: 07/08/2022 CLINICAL DATA:  Acute abdominal pain. Epigastric pain, onset this morning. EXAM: CT ABDOMEN AND PELVIS WITH CONTRAST TECHNIQUE: Multidetector CT imaging of the abdomen and pelvis was performed using the standard protocol following bolus administration of intravenous contrast. RADIATION DOSE REDUCTION: This exam was performed according to the departmental dose-optimization program which includes automated exposure control, adjustment of the mA and/or kV according to patient size and/or use of iterative reconstruction technique. CONTRAST:  OMNIPAQUE IOHEXOL 300 MG/ML  SOLN COMPARISON:  Right upper quadrant ultrasound earlier today. FINDINGS: Lower chest: Subsegmental atelectasis at the left lung base. No pleural effusion or confluent airspace disease. Hepatobiliary: Minimal focal fatty infiltration adjacent to the falciform ligament. Suspicious liver lesion. Gallbladder physiologically distended, no calcified stone. No biliary dilatation. Pancreas: No ductal dilatation or inflammation. Spleen: Normal in size without focal abnormality. Adrenals/Urinary Tract: Normal adrenal glands. Horseshoe kidney configuration with fusion of the lower poles. No hydronephrosis.  No renal calculi. Tiny hypodensity in the lower pole cortex of the left renal moiety likely represent small cyst, too small to accurately characterize. Urinary bladder is empty. Stomach/Bowel: Unremarkable appearance of the stomach. Scattered fluid-filled small bowel that is not abnormally dilated. Occasional areas of hyperemia. No wall thickening. Fluid/liquid stool in the colon. No associated colonic wall thickening or inflammation. Minimal fluid in the appendix which is similar to the adjacent large and small bowel. No appendicitis. Vascular/Lymphatic: Normal caliber abdominal aorta. Patent portal, splenic, and mesenteric veins. Abdominopelvic adenopathy. Reproductive: Uterus and bilateral adnexa are unremarkable. Other: Tiny fat containing umbilical hernia. No free air, free fluid, or intra-abdominal fluid collection. Musculoskeletal: Mild L5-S1 degenerative disc disease. There are no acute or suspicious osseous abnormalities. IMPRESSION: 1. Fluid/liquid stool in the colon and scattered fluid-filled small bowel, suggesting diarrheal illness/small-bowel enteritis.  No bowel obstruction or inflammation. 2. Horseshoe kidney configuration. Electronically Signed   By: Narda Rutherford M.D.   On: 07/08/2022 23:35   US Abdomen Limited  Result Date: 07/08/2022 CLINICAL DATA:  Upper abdominal pain EXAM: ULTRASOUND ABDOMEN LIMITED RIGHT UPPER QUADRANT COMPARISON:  None Available. FINDINGS: Gallbladder: No gallstones or wall thickening visualized. No sonographic Betta Balla sign noted by sonographer. Common bile duct: Diameter: Normal caliber, 2 mm Liver: No focal lesion identified. Within normal limits in parenchymal echogenicity. Portal vein is patent on color Doppler imaging with normal direction of blood flow towards the liver. Other: None. IMPRESSION: Negative. Electronically Signed   By: Charlett Nose M.D.   On: 07/08/2022 21:02    Procedures Procedures    Medications Ordered in ED Medications  sodium chloride  (PF) 0.9 % injection (has no administration in time range)  alum & mag hydroxide-simeth (MAALOX/MYLANTA) 200-200-20 MG/5ML suspension 30 mL (30 mLs Oral Given 07/08/22 2021)    And  lidocaine (XYLOCAINE) 2 % viscous mouth solution 15 mL (15 mLs Oral Given 07/08/22 2021)  sodium chloride 0.9 % bolus 1,000 mL (1,000 mLs Intravenous New Bag/Given 07/08/22 2223)  ondansetron (ZOFRAN) injection 4 mg (4 mg Intravenous Given 07/08/22 2225)  morphine (PF) 4 MG/ML injection 4 mg (4 mg Intravenous Given 07/08/22 2225)  iohexol (OMNIPAQUE) 300 MG/ML solution 100 mL (100 mLs Intravenous Contrast Given 07/08/22 2303)    ED Course/ Medical Decision Making/ A&P                           Medical Decision Making Amount and/or Complexity of Data Reviewed Labs: ordered. Radiology: ordered.  Risk OTC drugs. Prescription drug management.   This patient presents to the ED for concern of upper abdominal pain and diarrhea, this involves an extensive number of treatment options, and is a complaint that carries with it a high risk of complications and morbidity.  The differential diagnosis includes peptic ulcer disease, gastritis, acute cholecystitis, colitis   Co morbidities that complicate the patient evaluation  Migraine, Raynaud's, cervical dysplasia   Additional history obtained:  External records from outside source obtained and reviewed including recent visit to vascular vein specialist on June 26, 2022 where patient is followed for thoracic outlet and recently completed PT which was nonspecific to thoracic outlet and has additional planned PT.   Lab Tests:  I Ordered, and personally interpreted labs.  The pertinent results include: Urinalysis is hazy, does not suggest UTI.  hCG is negative.  Lipase is normal.  CMP with bicarb of 19, otherwise unremarkable.  CBC with leukocytosis white count 14.3 with increase in neutrophils.  Hemoglobin hematocrit also increased suggesting  hemoconcentrated.   Imaging Studies ordered:  I ordered imaging studies including right upper quadrant ultrasound as well as CT abdomen pelvis with contrast I independently visualized and interpreted imaging which showed US unremarkable, CT with incidental finding of horseshoe kidney; enteritis  I agree with the radiologist interpretation  Problem List / ED Course / Critical interventions / Medication management  31yo female with nausea, abdominal pain and diarrhea. Found to have upper abdominal pain on exam. Labs with leukocytosis, RUQ US unremarkable. CT for onoging pain without improvement with GI cocktail and elevated WBC, found to have enteritis. Incidental finding of horseshoe kidney, pt unaware of this previously, will refer to urology for follow up. Otherwise, feeling somewhat better with Morphine and Zofran, will dc with omeprazole and zofran.  I ordered medication including GI cocktail  for abdominal pain Reevaluation of the patient after these medicines showed that the patient stayed the same.  At which point patient was given Zofran, morphine and IV fluids. I have reviewed the patients home medicines and have made adjustments as needed   Social Determinants of Health:  Has PCP   Test / Admission - Considered:  Improved after meds, plan is for dc with recheck PCP, return to ER for worsening or concerning symptoms.          Final Clinical Impression(s) / ED Diagnoses Final diagnoses:  Generalized abdominal pain  Enteritis  Horseshoe kidney    Rx / DC Orders ED Discharge Orders          Ordered    ondansetron (ZOFRAN-ODT) 4 MG disintegrating tablet  Every 8 hours PRN        07/08/22 2344    omeprazole (PRILOSEC) 20 MG capsule  Daily        07/08/22 2344              Jeannie Fend, PA-C 07/08/22 2351    Pricilla Loveless, MD 07/09/22 1451

## 2022-07-08 NOTE — ED Triage Notes (Signed)
Pt arrived via POV, c/o sharp abd pain and diarrhea today. Denies any fevers, vomiting or urinary issues.

## 2022-07-09 ENCOUNTER — Ambulatory Visit: Payer: Managed Care, Other (non HMO) | Attending: Vascular Surgery

## 2022-07-09 DIAGNOSIS — G540 Brachial plexus disorders: Secondary | ICD-10-CM | POA: Insufficient documentation

## 2022-07-09 DIAGNOSIS — M79602 Pain in left arm: Secondary | ICD-10-CM | POA: Insufficient documentation

## 2022-07-09 DIAGNOSIS — M6281 Muscle weakness (generalized): Secondary | ICD-10-CM | POA: Insufficient documentation

## 2022-07-17 ENCOUNTER — Encounter: Payer: Self-pay | Admitting: Physical Therapy

## 2022-07-17 ENCOUNTER — Other Ambulatory Visit: Payer: Self-pay

## 2022-07-17 ENCOUNTER — Ambulatory Visit: Payer: Managed Care, Other (non HMO) | Admitting: Physical Therapy

## 2022-07-17 DIAGNOSIS — M79602 Pain in left arm: Secondary | ICD-10-CM | POA: Diagnosis not present

## 2022-07-17 DIAGNOSIS — G540 Brachial plexus disorders: Secondary | ICD-10-CM | POA: Diagnosis not present

## 2022-07-17 DIAGNOSIS — M6281 Muscle weakness (generalized): Secondary | ICD-10-CM | POA: Diagnosis not present
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
## 2022-07-23 ENCOUNTER — Ambulatory Visit: Payer: Managed Care, Other (non HMO)

## 2022-07-23 DIAGNOSIS — M79602 Pain in left arm: Secondary | ICD-10-CM | POA: Diagnosis not present

## 2022-07-23 DIAGNOSIS — M6281 Muscle weakness (generalized): Secondary | ICD-10-CM

## 2022-07-23 NOTE — Therapy (Addendum)
OUTPATIENT PHYSICAL THERAPY TREATMENT   Patient Name: Barbara Mcfarland MRN: [id number] DOB:1991-04-26, 32 y.o., female Today's Date: 07/23/2022   PT End of Session - 07/23/22 1144     Visit Number 2    Date for PT Re-Evaluation 09/11/22    Authorization Type [employer]    PT Start Time 1102    PT Stop Time 1145    PT Time Calculation (min) 43 min    Activity Tolerance Patient tolerated treatment well    Behavior During Therapy [facility] for tasks assessed/performed              Past Medical History:  Diagnosis Date   ASCUS with positive high risk HPV cervical pap smear at [facility] 04/09/2018   Migraine headache from childhood   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   Raynaud phenomenon 02/20/2016   Past Surgical History:  Procedure Laterality Date   NO PAST SURGERIES     Patient Active Problem List   Diagnosis Date Noted   Vitamin D deficiency 01/31/2022   PCB (post coital bleeding) 09/10/2021   Friable cervix 09/10/2021   Brachial plexopathy 07/30/2021   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   ASCUS with positive high risk HPV cervical pap smear at [facility] 04/09/2018   Migraine with aura and with status migrainosus, not intractable 11/15/2013    PCP: Durene Fruits NP  REFERRING PROVIDER: Dr. Donzetta Matters  REFERRING DIAG: left thoracic outlet syndrome  THERAPY DIAG:  Pain in left arm  Muscle weakness (generalized)  Rationale for Evaluation and Treatment Rehabilitation  ONSET DATE: >1 year ago  SUBJECTIVE:                                                                                                                                                                                                         SUBJECTIVE STATEMENT: I feel OK.  No pain today.    Right hand dominant  PERTINENT HISTORY:  Migraines; "horseshoe" kidneys Per vascular MD note: try PT treatment, next step would be scalene block and/or surgery for 1st rib removal History of  LBP  PAIN:  Are you having pain? Yes NPRS scale: 0/10.  If I use my arm for too long, it bothers me, and feels weak.   Pain location: tingly lateral elbow to 5th finger   Aggravating factors: carrying something heavy; bend elbow fully feels like bending a nerve; working out at the gym; can't stand pressure on left wrist  Relieving factors: nothing  PRECAUTIONS: None  WEIGHT BEARING RESTRICTIONS No  FALLS:  Has patient  fallen in last 6 months? No  LIVING ENVIRONMENT: Lives with: lives with their family   OCCUPATION: starting a new career translating  PLOF: Independent  PATIENT GOALS get some relief and use arm normally; good strength   OBJECTIVE:   DIAGNOSTIC FINDINGS:  Normal nerve conduction study  MRI of the left brachial plexus was normal as well as a left arm EMG.  Ultrasound normal  PATIENT SURVEYS:  FOTO 63%   COGNITION: Overall cognitive status: Within functional limits for tasks assessed   SENSATION: Light touch: Impaired in 4th and 5th fingers  POSTURE: rounded shoulders;  when lying supine unable to fully supinate for palm up position   PALPATION: Tender points in upper traps; decreased pectoral mobility  CERVICAL ROM:   No change in UE symptoms with cervical ROM  Active ROM A/PROM (deg) eval  Flexion WFLs  Extension WFLs  Right lateral flexion 40  Left lateral flexion 46  Right rotation WFLs  Left rotation WFLs   (Blank rows = not tested)  UPPER EXTREMITY ROM: Grossly WFLs and symmetrical but reports left shoulder internal rotation more difficult  UPPER EXTREMITY MMT:    Note: unable to initiate a serratus punch in supine but with assist able to hold briefly isometrically  MMT Right eval Left eval  Shoulder flexion 5 4-  Shoulder extension 5 4  Shoulder abduction 5 4-  Shoulder adduction    Shoulder extension 5 4  Shoulder internal rotation 5 4-  Shoulder external rotation 5 4-  Middle trapezius 5 3+  Lower trapezius 5 3+   Elbow flexion 5 4  Elbow extension 5 4  Wrist flexion 5 4  Wrist extension 5 4  Wrist ulnar deviation    Wrist radial deviation    Wrist pronation    Wrist supination    Grip strength 65# 20#   (Blank rows = not tested)  CERVICAL SPECIAL TESTS:  Upper limb tension test (ULTT): Positive and Distraction test: Negative    TODAY'S TREATMENT:  Date: 07/23/22 HEP established-see below  Upper trap and levator stretch on Lt 2x30 seconds Pec stretch in doorway 5x10 seconds  Red band: rowing, shoulder extension and ER 2x10.  1 set of ER in supine due to fatigue in sitting Seated thoracic extension 10 seconds x 5 Open book x 10 each Arm bike: Level 1x 6 minutes (3/3)- fatigue with this.   07/17/22: Treatment plan   PATIENT EDUCATION:  Education details: Access Code: [email or web address] Person educated: Patient Education method: Explanation Education comprehension: verbalized understanding   HOME EXERCISE PROGRAM: Access Code: [email or web address] URL: [email or web address] Date: 07/23/2022 Prepared by: Claiborne Billings  Exercises - Seated Cervical Sidebending Stretch  - 2-3 x daily - 7 x weekly - 1 sets - 3 reps - 20 hold - Gentle Levator Scapulae Stretch  - 2-3 x daily - 7 x weekly - 1 sets - 3 reps - 20 hold - Doorway Pec Stretch at 90 Degrees Abduction  - 2-3 x daily - 7 x weekly - 1 sets - 3 reps - 20 hold - Standing Shoulder Row with Anchored Resistance  - 2 x daily - 7 x weekly - 2 sets - 10 reps - Shoulder Extension with Resistance  - 2 x daily - 7 x weekly - 2 sets - 10 reps - Standing Shoulder External Rotation with Resistance  - 2 x daily - 7 x weekly - 2 sets - 10 reps - Seated Thoracic Lumbar Extension with Pectoralis Stretch  - 2-3 x daily -  7 x weekly - 1 sets - 5 reps - 10 hold  ASSESSMENT:  CLINICAL IMPRESSION: 1st time follow-up after evaluation.  Session spent establishing a HEP as pt has not been doing previously issued HEP from prior PT in October 2022. Pt did well with  exercises issued today and required minimal to moderate tactile and verbal cues for alignment and to reduced scapular elevation. Pt fatigued easily on the Lt UE with band work.  She would benefit from PT for pec minor muscle lengthening, serratus anterior strengthening, rhomboid strengthening , Lt UE endurance and neural mobility.   OBJECTIVE IMPAIRMENTS decreased activity tolerance, decreased ROM, decreased strength, increased fascial restrictions, impaired perceived functional ability, impaired UE functional use, postural dysfunction, and pain.   ACTIVITY LIMITATIONS carrying, lifting, reach over head, and caring for others  PARTICIPATION LIMITATIONS: cleaning and occupation  PERSONAL FACTORS Time since onset of injury/illness/exacerbation are also affecting patient's functional outcome.   REHAB POTENTIAL: Good  CLINICAL DECISION MAKING: Stable/uncomplicated  EVALUATION COMPLEXITY: Low   GOALS: Goals reviewed with patient? Yes  SHORT TERM GOALS: Target date: 08/14/2022   The patient will demonstrate knowledge of basic self care strategies and exercises to promote healing   Baseline:  Goal status: INITIAL  2.  The patient will report a 30% improvement in pain levels with functional activities which are currently difficult including lifting and carrying Baseline:  Goal status: INITIAL  3.  Improved glenohumeral and scapular muscle strength to 4/5  Baseline:  Goal status: INITIAL  4.  Improved cervical side bending ROM to 50 degrees and left internal rotation ROM with ease Baseline:  Goal status: INITIAL   LONG TERM GOALS: Target date: 09/11/2022  The patient will be independent in a safe self progression of a home exercise program to promote further recovery of function   Baseline:  Goal status: INITIAL  2.  The patient will report a 60% improvement in pain levels with functional activities which are currently difficult including lifting/carrying items in left  hand Baseline:  Goal status: INITIAL  3.  Improved grip strength to 25# Baseline:  Goal status: INITIAL  4.  Improve left shoulder, scapular and elbow strength to grossly 4 to 4+/5 needed for ADLs Baseline:  Goal status: INITIAL  5.  The patient will have improved FOTO score to   72%    indicating improved function with less pain  Baseline:  Goal status: INITIAL     PLAN: PT FREQUENCY: 2x/week  PT DURATION: 8 weeks  PLANNED INTERVENTIONS: Therapeutic exercises, Therapeutic activity, Neuromuscular re-education, Patient/Family education, Self Care, Joint mobilization, Aquatic Therapy, Dry Needling, Electrical stimulation, Spinal mobilization, Cryotherapy, Moist heat, Taping, Traction, Ionotophoresis 61m/ml Dexamethasone, and Manual therapy  PLAN FOR NEXT SESSION:  review HEP issued today, Lt UE endurance,  possible DN to pec minor and upper traps; thoracic extension  KSigurd Sos PT 07/23/22 11:46 AM  PHYSICAL THERAPY DISCHARGE SUMMARY  Visits from Start of Care: 2  Current functional level related to goals / functional outcomes: Pt called to cancel all remaining appts due to change in insurance.     Remaining deficits: See above for most current status.     Education / Equipment: HEP   Patient agrees to discharge. Patient goals were not met. Patient is being discharged due to financial reasons. KSigurd Sos PT 08/06/22 8:55 AM   [place] [place] [address]Phone # [phone] [phone]

## 2022-08-01 ENCOUNTER — Ambulatory Visit
Payer: Managed Care, Other (non HMO) | Attending: Vascular Surgery | Admitting: Rehabilitative and Restorative Service Providers"

## 2022-08-01 DIAGNOSIS — M79602 Pain in left arm: Secondary | ICD-10-CM | POA: Insufficient documentation

## 2022-08-01 DIAGNOSIS — M6281 Muscle weakness (generalized): Secondary | ICD-10-CM | POA: Insufficient documentation

## 2022-08-01 DIAGNOSIS — G540 Brachial plexus disorders: Secondary | ICD-10-CM | POA: Insufficient documentation

## 2022-08-06 ENCOUNTER — Ambulatory Visit: Payer: Managed Care, Other (non HMO)

## 2022-09-04 ENCOUNTER — Ambulatory Visit: Payer: Managed Care, Other (non HMO) | Admitting: Vascular Surgery

## 2022-09-10 ENCOUNTER — Ambulatory Visit: Payer: Managed Care, Other (non HMO)

## 2022-12-20 ENCOUNTER — Ambulatory Visit (INDEPENDENT_AMBULATORY_CARE_PROVIDER_SITE_OTHER): Payer: Self-pay | Admitting: Obstetrics and Gynecology

## 2022-12-20 ENCOUNTER — Other Ambulatory Visit: Payer: Self-pay

## 2022-12-20 ENCOUNTER — Other Ambulatory Visit (HOSPITAL_COMMUNITY)
Admission: RE | Admit: 2022-12-20 | Discharge: 2022-12-20 | Disposition: A | Discharge status: Home / Self Care | Payer: Self-pay | Source: Ambulatory Visit | Attending: Obstetrics and Gynecology | Admitting: Obstetrics and Gynecology

## 2022-12-20 VITALS — BP 116/78 | HR 62 | Ht 66.0 in | Wt 172.6 lb

## 2022-12-20 DIAGNOSIS — N930 Postcoital and contact bleeding: Secondary | ICD-10-CM

## 2022-12-20 DIAGNOSIS — Z113 Encounter for screening for infections with a predominantly sexual mode of transmission: Secondary | ICD-10-CM

## 2022-12-20 NOTE — Progress Notes (Signed)
NEW GYNECOLOGY PATIENT Patient name: Barbara Mcfarland MRN [id number]  Date of birth: [birth year] Chief Complaint:   No chief complaint on file.     History:  Barbara Mcfarland is a 31 y.o. G1P1001 being seen today for bleeding with nexplanon and concern for recurrence of post-coital bleeding.    Nexplanon in place for 1 year. Nexplanin heas helpe dwiht heavy bleeding. States having some abnormal cells and that required some procedures including burning. Review of chart shows abnormal paps with subsequent LEEP. Prior complaint of post-coital bleeding, possible polyp seen on exam.  On last OB/GYN visit no polyp seen on exam.  Does not engage in penetrative intercourse recently.  Has engaged in external masturbation, without any bleeding.  Would like repeat examination to ensure that her bleeding will not occur in the future.  Also would like STI screening today.      Gynecologic History No LMP recorded. Patient has had an implant. Contraception: Nexplanon Last Pap: 06/2021 Result was normal with negative HPV Last Mammogram: n/a Last Colonoscopy: n/a  Obstetric History OB History  Gravida Para Term Preterm AB Living  1 1 1     1   SAB IAB Ectopic Multiple Live Births        0 1    # Outcome Date GA Lbr Len/2nd Weight Sex Delivery Anes PTL Lv  1 Term 06/21/17 [redacted]w[redacted]d / 00:12 8 lb 5.2 oz (3.775 kg) F Vag-Spont None  LIV    Past Medical History:  Diagnosis Date   ASCUS with positive high risk HPV cervical pap smear at [facility] 04/09/2018   Migraine headache from childhood   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   Raynaud phenomenon 02/20/2016    Past Surgical History:  Procedure Laterality Date   NO PAST SURGERIES      Current Outpatient Medications on File Prior to Visit  Medication Sig Dispense Refill   etonogestrel (NEXPLANON) 68 MG IMPL implant 1 each by Subdermal route once.     diclofenac (VOLTAREN) 75 MG EC tablet Take 1 tablet (75 mg total) by mouth 2 (two) times  daily. (Patient not taking: Reported on 12/20/2022) 20 tablet 0   diphenhydrAMINE (BENADRYL) 25 MG tablet Take 1 tablet (25 mg total) by mouth at bedtime as needed for up to 15 doses. (Patient not taking: Reported on 05/08/2022) 15 tablet 0   famotidine (PEPCID) 20 MG tablet Take 1 tablet (20 mg total) by mouth 2 (two) times daily. (Patient not taking: Reported on 12/20/2022) 30 tablet 0   gabapentin (NEURONTIN) 100 MG capsule Take 1 capsule (100 mg total) by mouth 3 (three) times daily. (Patient not taking: Reported on 12/20/2022) 60 capsule 1   meclizine (ANTIVERT) 12.5 MG tablet Take 1 tablet (12.5 mg total) by mouth 3 (three) times daily as needed for dizziness. (Patient not taking: Reported on 12/20/2022) 30 tablet 0   methocarbamol (ROBAXIN) 500 MG tablet Take 1 tablet (500 mg total) by mouth every 8 (eight) hours as needed for muscle spasms. (Patient not taking: Reported on 12/20/2022) 20 tablet 1   metoCLOPramide (REGLAN) 10 MG tablet Take 1 tablet (10 mg total) by mouth every 12 (twelve) hours as needed for up to 10 doses for nausea. (Patient not taking: Reported on 05/08/2022) 10 tablet 0   omeprazole (PRILOSEC) 20 MG capsule Take 1 capsule (20 mg total) by mouth daily. 30 capsule 0   ondansetron (ZOFRAN-ODT) 4 MG disintegrating tablet Take 1 tablet (4 mg total) by mouth every  8 (eight) hours as needed for nausea or vomiting. (Patient not taking: Reported on 12/20/2022) 12 tablet 0   rizatriptan (MAXALT-MLT) 10 MG disintegrating tablet Take 1 tablet (10 mg total) by mouth as needed for migraine. May repeat in 2 hours if needed (Patient not taking: Reported on 12/20/2022) 9 tablet 11   topiramate (TOPAMAX) 50 MG tablet Take 1 tablet (50 mg total) by mouth 2 (two) times daily. (Patient not taking: Reported on 12/20/2022) 60 tablet 12   No current facility-administered medications on file prior to visit.    No Known Allergies  Social History:  reports that she has never smoked. She has never  been exposed to tobacco smoke. She has never used smokeless tobacco. She reports that she does not currently use drugs after having used the following drugs: Marijuana. She reports that she does not drink alcohol.  Family History  Problem Relation Age of Onset   Healthy Mother    Healthy Father    Asthma Neg Hx    Cancer Neg Hx    Diabetes Neg Hx    Hypertension Neg Hx     The following portions of the patient's history were reviewed and updated as appropriate: allergies, current medications, past family history, past medical history, past social history, past surgical history and problem list.  Review of Systems Pertinent items noted in HPI and remainder of comprehensive ROS otherwise negative.  Physical Exam:  BP 116/78   Pulse 62   Ht 5\' 6"  (1.676 m)   Wt 172 lb 9.6 oz (78.3 kg)   BMI 27.86 kg/m  Physical Exam Vitals and nursing note reviewed. Exam conducted with a chaperone present.  Constitutional:      Appearance: Normal appearance.  Cardiovascular:     Rate and Rhythm: Normal rate.  Pulmonary:     Effort: Pulmonary effort is normal.     Breath sounds: Normal breath sounds.  Genitourinary:    General: Normal vulva.     Exam position: Lithotomy position.     Comments: Normal appearing cervix No polyp seen Cervix not friable with palpation using cotton swab Neurological:     General: No focal deficit present.     Mental Status: She is alert and oriented to person, place, and time.  Psychiatric:        Mood and Affect: Mood normal.        Behavior: Behavior normal.        Thought Content: Thought content normal.        Judgment: Judgment normal.     Assessment and Plan:   1. Routine screening for STI (sexually transmitted infection) Routine screening today  - Cervicovaginal ancillary only - RPR - HIV antibody (with reflex) - Hepatitis B Surface AntiGEN - Hepatitis C Antibody  2. Postcoital bleeding No bleeding with masturbation or examination today.  Possible ectropion bleed previously but cervix appears normal today, do not anticipate bleed with penetration going forward.    Routine preventative health maintenance measures emphasized. Please refer to After Visit Summary for other counseling recommendations.   Follow-up: No follow-ups on file.   , MD Obstetrician & Gynecologist, [facility], [facility]

## 2022-12-21 LAB — HIV ANTIBODY (ROUTINE TESTING W REFLEX): HIV Screen 4th Generation wRfx: NONREACTIVE

## 2022-12-21 LAB — RPR: RPR Ser Ql: NONREACTIVE

## 2022-12-21 LAB — HEPATITIS C ANTIBODY: Hep C Virus Ab: NONREACTIVE

## 2022-12-21 LAB — HEPATITIS B SURFACE ANTIGEN: Hepatitis B Surface Ag: NEGATIVE

## 2022-12-24 LAB — CERVICOVAGINAL ANCILLARY ONLY
Bacterial Vaginitis (gardnerella): NEGATIVE
Candida Glabrata: NEGATIVE
Candida Vaginitis: NEGATIVE
Chlamydia: NEGATIVE
Comment: NEGATIVE
Comment: NEGATIVE
Comment: NEGATIVE
Comment: NEGATIVE
Comment: NEGATIVE
Comment: NORMAL
Neisseria Gonorrhea: NEGATIVE
Trichomonas: NEGATIVE

## 2022-12-25 ENCOUNTER — Encounter: Payer: Managed Care, Other (non HMO) | Admitting: Obstetrics and Gynecology

## 2023-02-10 ENCOUNTER — Telehealth: Payer: Self-pay | Admitting: Diagnostic Neuroimaging

## 2023-02-11 ENCOUNTER — Emergency Department (HOSPITAL_COMMUNITY)
Admission: EM | Admit: 2023-02-11 | Discharge: 2023-02-11 | Disposition: A | Discharge status: Home / Self Care | Payer: Self-pay | Attending: Emergency Medicine | Admitting: Emergency Medicine

## 2023-02-11 ENCOUNTER — Other Ambulatory Visit: Payer: Self-pay

## 2023-02-11 ENCOUNTER — Emergency Department (HOSPITAL_COMMUNITY): Payer: Self-pay

## 2023-02-11 ENCOUNTER — Encounter (HOSPITAL_COMMUNITY): Payer: Self-pay | Admitting: Emergency Medicine

## 2023-02-11 DIAGNOSIS — R1030 Lower abdominal pain, unspecified: Secondary | ICD-10-CM | POA: Insufficient documentation

## 2023-02-11 LAB — URINALYSIS, ROUTINE W REFLEX MICROSCOPIC
Bilirubin Urine: NEGATIVE
Glucose, UA: NEGATIVE mg/dL
Hgb urine dipstick: NEGATIVE
Ketones, ur: NEGATIVE mg/dL
Leukocytes,Ua: NEGATIVE
Nitrite: NEGATIVE
Protein, ur: NEGATIVE mg/dL
Specific Gravity, Urine: 1.005 (ref 1.005–1.030)
pH: 6 (ref 5.0–8.0)

## 2023-02-11 LAB — COMPREHENSIVE METABOLIC PANEL
ALT: 15 U/L (ref 0–44)
AST: 17 U/L (ref 15–41)
Albumin: 4.2 g/dL (ref 3.5–5.0)
Alkaline Phosphatase: 86 U/L (ref 38–126)
Anion gap: 11 (ref 5–15)
BUN: 7 mg/dL (ref 6–20)
CO2: 25 mmol/L (ref 22–32)
Calcium: 9.7 mg/dL (ref 8.9–10.3)
Chloride: 104 mmol/L (ref 98–111)
Creatinine, Ser: 0.7 mg/dL (ref 0.44–1.00)
GFR, Estimated: >60 mL/min (ref >60)
Glucose, Bld: 83 mg/dL (ref 70–99)
Potassium: 3.8 mmol/L (ref 3.5–5.1)
Sodium: 140 mmol/L (ref 135–145)
Total Bilirubin: 0.3 mg/dL (ref 0.3–1.2)
Total Protein: 7.6 g/dL (ref 6.5–8.1)

## 2023-02-11 LAB — CBC WITH DIFFERENTIAL/PLATELET
Abs Immature Granulocytes: 0.01 10*3/uL (ref 0.00–0.07)
Basophils Absolute: 0 10*3/uL (ref 0.0–0.1)
Basophils Relative: 0 %
Eosinophils Absolute: 0.2 10*3/uL (ref 0.0–0.5)
Eosinophils Relative: 3 %
HCT: 42.4 % (ref 36.0–46.0)
Hemoglobin: 14.5 g/dL (ref 12.0–15.0)
Immature Granulocytes: 0 %
Lymphocytes Relative: 40 %
Lymphs Abs: 2.1 10*3/uL (ref 0.7–4.0)
MCH: 30.9 pg (ref 26.0–34.0)
MCHC: 34.2 g/dL (ref 30.0–36.0)
MCV: 90.2 fL (ref 80.0–100.0)
Monocytes Absolute: 0.5 10*3/uL (ref 0.1–1.0)
Monocytes Relative: 8 %
Neutro Abs: 2.6 10*3/uL (ref 1.7–7.7)
Neutrophils Relative %: 49 %
Platelets: 228 10*3/uL (ref 150–400)
RBC: 4.7 MIL/uL (ref 3.87–5.11)
RDW: 11.9 % (ref 11.5–15.5)
WBC: 5.4 10*3/uL (ref 4.0–10.5)
nRBC: 0 % (ref 0.0–0.2)

## 2023-02-11 LAB — I-STAT BETA HCG BLOOD, ED (MC, WL, AP ONLY): I-stat hCG, quantitative: <5 m[IU]/mL (ref <5)

## 2023-02-11 MED ORDER — KETOROLAC TROMETHAMINE 30 MG/ML IJ SOLN
30.0000 mg | Freq: Once | INTRAMUSCULAR | Status: AC
  Administered 2023-02-11: 30 mg via INTRAMUSCULAR
  Filled 2023-02-11: qty 1

## 2023-02-11 MED ORDER — DICYCLOMINE HCL 20 MG PO TABS: 20.0000 mg | ORAL_TABLET | Freq: Two times a day (BID) | ORAL | 0 refills | Status: DC

## 2023-02-11 MED ORDER — DICYCLOMINE HCL 10 MG PO CAPS
10.0000 mg | ORAL_CAPSULE | Freq: Once | ORAL | Status: AC
  Administered 2023-02-11: 10 mg via ORAL
  Filled 2023-02-11: qty 1

## 2023-02-11 NOTE — ED Notes (Signed)
Patient verbalizes understanding of d/c instructions. Opportunities for questions and answers were provided. Pt d/c from ED and ambulated to lobby.

## 2023-02-11 NOTE — ED Provider Triage Note (Signed)
Emergency Medicine Provider Triage Evaluation Note  Barbara Mcfarland , a 32 y.o. female  was evaluated in triage.  Pt complains of abdominal cramps after lifting dumbbell weights in the gym 2 weeks ago.  Endorses nausea however no vomiting or diarrhea.  Seen recently by OB/GYN who recommended further evaluation at ED if pain did not resolve.  Denies fevers or chills.  Pain worsens with constriction from jeans or direct pressure.  Nexplanon in arm 1.5 years ago.  Does not menstruate since.  Denies vaginal discharge/bleeding.  Review of Systems  Positive:  Negative: See above  Physical Exam  BP (!) 121/104 (BP Location: Right Arm)   Pulse 78   Temp 98.7 F (37.1 C) (Oral)   Resp 18   Ht 5' 6"$  (1.676 m)   Wt 77.6 kg   SpO2 100%   BMI 27.60 kg/m  Gen:   Awake, no distress   Resp:  Normal effort  MSK:   Moves extremities without difficulty  Other:  Abdomen soft, mildly tender, nondistended.  Sitting comfortably.  Not diaphoretic.  Medical Decision Making  Medically screening exam initiated at 6:42 PM.  Appropriate orders placed.  Barbara Mcfarland was informed that the remainder of the evaluation will be completed by another provider, this initial triage assessment does not replace that evaluation, and the importance of remaining in the ED until their evaluation is complete.     Barbara [place], PA-C [id number]

## 2023-02-11 NOTE — Discharge Instructions (Addendum)
You were seen today for abdominal pain.  Your workup today is reassuring.  Given your recent OB/GYN workup and your workup today, unclear what is the cause of your pain.  Trial Bentyl to see if this helps.  You may need to follow-up with gastroenterology.  See provided resources.

## 2023-02-11 NOTE — ED Triage Notes (Signed)
Pt c/o lower abdominal cramping x2 weeks after lifting dumbbell in gym. Endorses nausea. Seen by ob/gyn and advised to come her if pain has not resolved. Pt states the pain increases with palpation.

## 2023-02-11 NOTE — ED Provider Notes (Signed)
[facility] Provider Note   CSN: [id number] Arrival date & time: 02/11/23  1755     History  Chief Complaint  Patient presents with   Abdominal Pain    Barbara Mcfarland is a 32 y.o. female.  HPI     This is a 32 year old female who presents with abdominal cramping.  Patient reports onset of symptoms approximately 2 weeks ago.  She first noted to the when she lifted a weight at the gym.  She finished her workout by walking.  Since that time she has had daily lower abdominal cramping.  It does not lateralize.  She saw her OB/GYN.  She states she had full STD testing and pelvic exam which was unremarkable last week.  She has trialed ibuprofen with minimal relief.  She states pain is worse when you palpate.  She describes bloating and having to wear loosefitting stretching pants.  No fevers or chills.  No urinary symptoms.  She has a Nexplanon so does not have regular bleeding.  Does not believe herself to be pregnant.  Home Medications Prior to Admission medications   Medication Sig Start Date End Date Taking? Authorizing Provider  dicyclomine (BENTYL) 20 MG tablet Take 1 tablet (20 mg total) by mouth 2 (two) times daily. 02/11/23  Yes Gian Ybarra, Barbette Hair, MD  diclofenac (VOLTAREN) 75 MG EC tablet Take 1 tablet (75 mg total) by mouth 2 (two) times daily. Patient not taking: Reported on 12/20/2022 02/21/22   Fransico Meadow, PA-C  diphenhydrAMINE (BENADRYL) 25 MG tablet Take 1 tablet (25 mg total) by mouth at bedtime as needed for up to 15 doses. Patient not taking: Reported on 05/08/2022 05/02/22   Wyvonnia Dusky, MD  etonogestrel (NEXPLANON) 68 MG IMPL implant 1 each by Subdermal route once. 07/05/21   [provider]  famotidine (PEPCID) 20 MG tablet Take 1 tablet (20 mg total) by mouth 2 (two) times daily. Patient not taking: Reported on 12/20/2022 02/04/22   Sherrill Raring, PA-C  gabapentin (NEURONTIN) 100 MG capsule Take 1 capsule  (100 mg total) by mouth 3 (three) times daily. Patient not taking: Reported on 12/20/2022 08/13/21   Rosemarie Ax, MD  meclizine (ANTIVERT) 12.5 MG tablet Take 1 tablet (12.5 mg total) by mouth 3 (three) times daily as needed for dizziness. Patient not taking: Reported on 12/20/2022 01/30/22   Camillia Herter, NP  methocarbamol (ROBAXIN) 500 MG tablet Take 1 tablet (500 mg total) by mouth every 8 (eight) hours as needed for muscle spasms. Patient not taking: Reported on 12/20/2022 05/29/22   Camillia Herter, NP  metoCLOPramide (REGLAN) 10 MG tablet Take 1 tablet (10 mg total) by mouth every 12 (twelve) hours as needed for up to 10 doses for nausea. Patient not taking: Reported on 05/08/2022 05/02/22   Wyvonnia Dusky, MD  omeprazole (PRILOSEC) 20 MG capsule Take 1 capsule (20 mg total) by mouth daily. 07/08/22 08/07/22  Suella Broad A, PA-C  ondansetron (ZOFRAN-ODT) 4 MG disintegrating tablet Take 1 tablet (4 mg total) by mouth every 8 (eight) hours as needed for nausea or vomiting. Patient not taking: Reported on 12/20/2022 07/08/22   Suella Broad A, PA-C  rizatriptan (MAXALT-MLT) 10 MG disintegrating tablet Take 1 tablet (10 mg total) by mouth as needed for migraine. May repeat in 2 hours if needed Patient not taking: Reported on 12/20/2022 05/08/22   Penumalli, Earlean Polka, MD  topiramate (TOPAMAX) 50 MG tablet Take 1 tablet (50  mg total) by mouth 2 (two) times daily. Patient not taking: Reported on 12/20/2022 05/08/22   Penni Bombard, MD      Allergies    Patient has no known allergies.    Review of Systems   Review of Systems  Constitutional:  Negative for fever.  Gastrointestinal:  Positive for abdominal pain and nausea. Negative for diarrhea and vomiting.  All other systems reviewed and are negative.   Physical Exam Updated Vital Signs BP 96/79   Pulse 70   Temp 98.6 F (37 C) (Oral)   Resp 17   Ht 1.676 m (5' 6"$ )   Wt 77.6 kg   SpO2 98%   BMI 27.60 kg/m  Physical  Exam Vitals and nursing note reviewed.  Constitutional:      Appearance: She is well-developed.  HENT:     Head: Normocephalic and atraumatic.  Eyes:     Pupils: Pupils are equal, round, and reactive to light.  Cardiovascular:     Rate and Rhythm: Normal rate and regular rhythm.     Heart sounds: Normal heart sounds.  Pulmonary:     Effort: Pulmonary effort is normal. No respiratory distress.     Breath sounds: No wheezing.  Abdominal:     General: Bowel sounds are normal.     Palpations: Abdomen is soft.     Tenderness: There is no abdominal tenderness. There is no guarding or rebound.     Hernia: No hernia is present.  Genitourinary:    Comments: Deferred Musculoskeletal:     Cervical back: Neck supple.  Skin:    General: Skin is warm and dry.  Neurological:     Mental Status: She is alert and oriented to person, place, and time.  Psychiatric:        Mood and Affect: Mood normal.     ED Results / Procedures / Treatments   Labs (all labs ordered are listed, but only abnormal results are displayed) Labs Reviewed  URINALYSIS, ROUTINE W REFLEX MICROSCOPIC - Abnormal; Notable for the following components:      Result Value   Color, Urine STRAW (*)    All other components within normal limits  CBC WITH DIFFERENTIAL/PLATELET  COMPREHENSIVE METABOLIC PANEL  I-STAT BETA HCG BLOOD, ED ([facility], [facility], [facility] ONLY)    EKG None  Radiology US Pelvis Complete  Result Date: 02/11/2023 CLINICAL DATA:  Lower abdominal pain with cramping. EXAM: TRANSABDOMINAL AND TRANSVAGINAL ULTRASOUND OF PELVIS DOPPLER ULTRASOUND OF OVARIES TECHNIQUE: Both transabdominal and transvaginal ultrasound examinations of the pelvis were performed. Transabdominal technique was performed for global imaging of the pelvis including uterus, ovaries, adnexal regions, and pelvic cul-de-sac. It was necessary to proceed with endovaginal exam following the transabdominal exam to visualize the uterus, endometrium, and  ovaries. Color and duplex Doppler ultrasound was utilized to evaluate blood flow to the ovaries. COMPARISON:  None. FINDINGS: Uterus Measurements: 5.7 x 3.3 x 4.5 cm = volume: 44.17 mL. No fibroids or other mass visualized. Endometrium Thickness: 4.7 mm.  No focal abnormality visualized. Right ovary Measurements: 3.0 x 1.5 x 2.0 cm = volume: 4.66 mL. There is a hypoechoic tubular structure in the right adnexa. Left ovary Measurements: 2.8 x 1.8 x 1.9 cm = volume: 4.95 mL. Normal appearance/no adnexal mass. Pulsed Doppler evaluation of both ovaries demonstrates normal low-resistance arterial and venous waveforms. Other findings No abnormal free fluid. IMPRESSION: 1. No evidence of ovarian torsion. 2. Hypoechoic tubular structure in the right adnexa, possible hydrosalpinx. Correlate clinically to exclude  superimposed infection. Electronically Signed   By: Brett Fairy M.D.   On: 02/11/2023 22:16   US Transvaginal Non-OB  Result Date: 02/11/2023 CLINICAL DATA:  Lower abdominal pain with cramping. EXAM: TRANSABDOMINAL AND TRANSVAGINAL ULTRASOUND OF PELVIS DOPPLER ULTRASOUND OF OVARIES TECHNIQUE: Both transabdominal and transvaginal ultrasound examinations of the pelvis were performed. Transabdominal technique was performed for global imaging of the pelvis including uterus, ovaries, adnexal regions, and pelvic cul-de-sac. It was necessary to proceed with endovaginal exam following the transabdominal exam to visualize the uterus, endometrium, and ovaries. Color and duplex Doppler ultrasound was utilized to evaluate blood flow to the ovaries. COMPARISON:  None. FINDINGS: Uterus Measurements: 5.7 x 3.3 x 4.5 cm = volume: 44.17 mL. No fibroids or other mass visualized. Endometrium Thickness: 4.7 mm.  No focal abnormality visualized. Right ovary Measurements: 3.0 x 1.5 x 2.0 cm = volume: 4.66 mL. There is a hypoechoic tubular structure in the right adnexa. Left ovary Measurements: 2.8 x 1.8 x 1.9 cm = volume: 4.95 mL.  Normal appearance/no adnexal mass. Pulsed Doppler evaluation of both ovaries demonstrates normal low-resistance arterial and venous waveforms. Other findings No abnormal free fluid. IMPRESSION: 1. No evidence of ovarian torsion. 2. Hypoechoic tubular structure in the right adnexa, possible hydrosalpinx. Correlate clinically to exclude superimposed infection. Electronically Signed   By: Brett Fairy M.D.   On: 02/11/2023 22:16   [place] Art/Ven Flow Abd Pelv Doppler  Result Date: 02/11/2023 CLINICAL DATA:  Lower abdominal pain with cramping. EXAM: TRANSABDOMINAL AND TRANSVAGINAL ULTRASOUND OF PELVIS DOPPLER ULTRASOUND OF OVARIES TECHNIQUE: Both transabdominal and transvaginal ultrasound examinations of the pelvis were performed. Transabdominal technique was performed for global imaging of the pelvis including uterus, ovaries, adnexal regions, and pelvic cul-de-sac. It was necessary to proceed with endovaginal exam following the transabdominal exam to visualize the uterus, endometrium, and ovaries. Color and duplex Doppler ultrasound was utilized to evaluate blood flow to the ovaries. COMPARISON:  None. FINDINGS: Uterus Measurements: 5.7 x 3.3 x 4.5 cm = volume: 44.17 mL. No fibroids or other mass visualized. Endometrium Thickness: 4.7 mm.  No focal abnormality visualized. Right ovary Measurements: 3.0 x 1.5 x 2.0 cm = volume: 4.66 mL. There is a hypoechoic tubular structure in the right adnexa. Left ovary Measurements: 2.8 x 1.8 x 1.9 cm = volume: 4.95 mL. Normal appearance/no adnexal mass. Pulsed Doppler evaluation of both ovaries demonstrates normal low-resistance arterial and venous waveforms. Other findings No abnormal free fluid. IMPRESSION: 1. No evidence of ovarian torsion. 2. Hypoechoic tubular structure in the right adnexa, possible hydrosalpinx. Correlate clinically to exclude superimposed infection. Electronically Signed   By: Brett Fairy M.D.   On: 02/11/2023 22:16    Procedures Procedures     Medications Ordered in ED Medications  dicyclomine (BENTYL) capsule 10 mg (10 mg Oral Given 02/11/23 2342)  ketorolac (TORADOL) 30 MG/ML injection 30 mg (30 mg Intramuscular Given 02/11/23 2340)    ED Course/ Medical Decision Making/ A&P                             Medical Decision Making Risk Prescription drug management.   This patient presents to the ED for concern of abdominal pain, this involves an extensive number of treatment options, and is a complaint that carries with it a high risk of complications and morbidity.  I considered the following differential and admission for this acute, potentially life threatening condition.  The differential diagnosis includes ovarian pathology, uterine pathology,  infection, UTI, IBS, less likely cholecystitis or appendicitis given nonlocalizing exam  MDM:    This is a 32 year old female who presents with lower abdominal cramping.  She is nontoxic and vital signs are reassuring.  She is afebrile.  Her exam is relatively nontender.  She reports full OB/GYN workup within 1 week including STD testing and pelvic exam which she states was normal.  She has deferred recheck today.  Labs obtained and reviewed.  No leukocytosis.  No metabolic derangements.  She did have an ultrasound from triage which was reviewed.  She has a tubular structure of the right adnexa possible hydrosalpinx.  Given reports of recent negative STD testing within the last week and nonlateralizing exam, feel this is more likely an incidental finding of an acute infection.  She also has no leukocytosis.  Given bloating, question whether the this may be more of a GI etiology.  Will start on Bentyl and give GI follow-up.  (Labs, imaging, consults)  Labs: I Ordered, and personally interpreted labs.  The pertinent results include: CBC, CMP, urinalysis, beta-hCG  Imaging Studies ordered: I ordered imaging studies including ultrasound I independently visualized and interpreted  imaging. I agree with the radiologist interpretation  Additional history obtained from chart review.  External records from outside source obtained and reviewed including prior evaluations, unable to visualize recent OB visit  Cardiac Monitoring: The patient was not maintained on a cardiac monitor.  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    Dispense:  20 tablet    Refill:  0    I have reviewed the patients home medicines and have made adjustments as needed  Problem List / ED Course: Problem List Items Addressed This Visit   None Visit Diagnoses     Lower abdominal pain    -  Primary                   Final Clinical Impression(s) / ED Diagnoses Final diagnoses:  Lower abdominal pain    Rx / DC Orders ED Discharge Orders          Ordered    dicyclomine (BENTYL) 20 MG tablet  2 times daily        02/11/23 2342              Merryl Hacker, MD 02/11/23 2352

## 2023-03-03 ENCOUNTER — Encounter: Payer: Self-pay | Admitting: Obstetrics and Gynecology

## 2023-03-03 ENCOUNTER — Other Ambulatory Visit: Payer: Self-pay

## 2023-03-03 ENCOUNTER — Telehealth: Payer: Self-pay

## 2023-03-03 ENCOUNTER — Ambulatory Visit (INDEPENDENT_AMBULATORY_CARE_PROVIDER_SITE_OTHER): Payer: Self-pay | Admitting: Obstetrics and Gynecology

## 2023-03-03 VITALS — BP 117/78 | HR 79

## 2023-03-03 DIAGNOSIS — R1084 Generalized abdominal pain: Secondary | ICD-10-CM

## 2023-03-03 NOTE — Progress Notes (Unsigned)
GYNECOLOGY VISIT  Patient name: Barbara Mcfarland MRN [id number]  Date of birth: 1991/06/19 Chief Complaint:   Abdominal Pain  History:  [name] is a 32 y.o. G1P1001 being seen today for SAB followup. Had sex at the beginning of February and then intense abdominal pain and bloating 2 weeks later (8/10 pain), went to ED after no relief with ibuprofen. ED workup negative and follow up. 2 weeks following, pain resolves now at 2/10 pain. (2/13- 2/27 had severe pain)  No fever or chills. Tapered off. Pain started suddenly when it happened. Occurred all throghout lower abdomen. Has nexplanon in place and interested in removal.   Not currently taking any meds due to no insurance.   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   Health Maintenance:   Last pap     Component Value Date/Time   DIAGPAP  01/27/2019 0000    NEGATIVE FOR INTRAEPITHELIAL LESIONS OR MALIGNANCY. BENIGN REACTIVE/REPARATIVE CHANGES.   ADEQPAP  01/27/2019 0000    Satisfactory for evaluation  endocervical/transformation zone component PRESENT.     Review of Systems:  Pertinent items are noted in HPI. Comprehensive review of systems was otherwise negative.   Objective:  Physical Exam BP 117/78   Pulse 79    Physical Exam   Labs and Imaging US Pelvis Complete  Result Date: 02/11/2023 CLINICAL DATA:  Lower abdominal pain with cramping. EXAM: TRANSABDOMINAL AND TRANSVAGINAL ULTRASOUND OF PELVIS DOPPLER  ULTRASOUND OF OVARIES TECHNIQUE: Both transabdominal and transvaginal ultrasound examinations of the pelvis were performed. Transabdominal technique was performed for global imaging of the pelvis including uterus, ovaries, adnexal regions, and pelvic cul-de-sac. It was necessary to proceed with endovaginal exam following the transabdominal exam to visualize the uterus, endometrium, and ovaries. Color and duplex Doppler ultrasound was utilized to evaluate blood flow to the ovaries. COMPARISON:  None. FINDINGS: Uterus Measurements: 5.7 x 3.3 x 4.5 cm = volume: 44.17 mL. No fibroids or other mass visualized. Endometrium Thickness: 4.7 mm.  No focal abnormality visualized. Right ovary Measurements: 3.0 x 1.5 x 2.0 cm = volume: 4.66 mL. There is a hypoechoic tubular structure in the right adnexa. Left ovary Measurements: 2.8 x 1.8 x 1.9 cm = volume: 4.95 mL. Normal appearance/no adnexal mass. Pulsed Doppler evaluation of both ovaries demonstrates normal low-resistance arterial and venous waveforms. Other findings No abnormal free fluid. IMPRESSION: 1. No evidence of ovarian torsion. 2. Hypoechoic tubular structure in the right adnexa, possible hydrosalpinx. Correlate clinically to exclude superimposed infection. Electronically Signed   By: Brett Fairy M.D.   On: 02/11/2023 22:16   US Transvaginal Non-OB  Result Date: 02/11/2023 CLINICAL DATA:  Lower abdominal pain with cramping. EXAM: TRANSABDOMINAL AND TRANSVAGINAL ULTRASOUND OF PELVIS DOPPLER ULTRASOUND OF OVARIES TECHNIQUE: Both transabdominal and transvaginal ultrasound examinations of the pelvis were performed. Transabdominal technique was performed for global imaging of the pelvis including uterus, ovaries, adnexal regions, and pelvic cul-de-sac. It was necessary to proceed with endovaginal exam following the transabdominal exam to visualize the  uterus, endometrium, and ovaries. Color and duplex Doppler ultrasound was utilized to evaluate blood flow to the  ovaries. COMPARISON:  None. FINDINGS: Uterus Measurements: 5.7 x 3.3 x 4.5 cm = volume: 44.17 mL. No fibroids or other mass visualized. Endometrium Thickness: 4.7 mm.  No focal abnormality visualized. Right ovary Measurements: 3.0 x 1.5 x 2.0 cm = volume: 4.66 mL. There is a hypoechoic tubular structure in the right adnexa. Left ovary Measurements: 2.8 x 1.8 x 1.9 cm = volume: 4.95 mL. Normal appearance/no adnexal mass. Pulsed Doppler evaluation of both ovaries demonstrates normal low-resistance arterial and venous waveforms. Other findings No abnormal free fluid. IMPRESSION: 1. No evidence of ovarian torsion. 2. Hypoechoic tubular structure in the right adnexa, possible hydrosalpinx. Correlate clinically to exclude superimposed infection. Electronically Signed   By: Brett Fairy M.D.   On: 02/11/2023 22:16   [place] Art/Ven Flow Abd Pelv Doppler  Result Date: 02/11/2023 CLINICAL DATA:  Lower abdominal pain with cramping. EXAM: TRANSABDOMINAL AND TRANSVAGINAL ULTRASOUND OF PELVIS DOPPLER ULTRASOUND OF OVARIES TECHNIQUE: Both transabdominal and transvaginal ultrasound examinations of the pelvis were performed. Transabdominal technique was performed for global imaging of the pelvis including uterus, ovaries, adnexal regions, and pelvic cul-de-sac. It was necessary to proceed with endovaginal exam following the transabdominal exam to visualize the uterus, endometrium, and ovaries. Color and duplex Doppler ultrasound was utilized to evaluate blood flow to the ovaries. COMPARISON:  None. FINDINGS: Uterus Measurements: 5.7 x 3.3 x 4.5 cm = volume: 44.17 mL. No fibroids or other mass visualized. Endometrium Thickness: 4.7 mm.  No focal abnormality visualized. Right ovary Measurements: 3.0 x 1.5 x 2.0 cm = volume: 4.66 mL. There is a hypoechoic tubular structure in the right adnexa. Left ovary Measurements: 2.8 x 1.8 x 1.9 cm = volume: 4.95 mL. Normal appearance/no adnexal mass. Pulsed Doppler evaluation of both ovaries  demonstrates normal low-resistance arterial and venous waveforms. Other findings No abnormal free fluid. IMPRESSION: 1. No evidence of ovarian torsion. 2. Hypoechoic tubular structure in the right adnexa, possible hydrosalpinx. Correlate clinically to exclude superimposed infection. Electronically Signed   By: Brett Fairy M.D.   On: 02/11/2023 22:16       Assessment & Plan:   1. Generalized abdominal pain Abdominal pain has clinically resolved.   Referral to [facility] for pap smear and nexplanon removal    Routine preventative health maintenance measures emphasized.  Darliss Cheney, MD [facility] [place]

## 2023-03-03 NOTE — Telephone Encounter (Signed)
Left message for patient with name and number to call back and schedule pap appointment.

## 2023-03-26 ENCOUNTER — Other Ambulatory Visit: Payer: Self-pay | Admitting: Hematology and Oncology

## 2023-03-26 DIAGNOSIS — Z124 Encounter for screening for malignant neoplasm of cervix: Secondary | ICD-10-CM

## 2023-03-26 NOTE — Progress Notes (Signed)
Patient: Barbara Mcfarland           Date of Birth: 1991/03/30           MRN: [id number] Visit Date: 03/26/2023 PCP: Camillia Herter, NP  Cervical Cancer Screening Do you smoke?: No Have you ever had or been told you have an allergy to latex products?: No Marital status: Single Date of last pap smear: 2-5 yrs ago Number of pregnancies: 1 Number of births: 1 Have you ever had any of the following? Hysterectomy: No Tubal ligation (tubes tied): No Abnormal bleeding: No Abnormal pap smear: No Venereal warts: No A sex partner with venereal warts: No A high risk* sex partner: No  Cervical Exam Pap smear completed: Pap test Abnormal Observations: Normal exam Recommendations: Will follow up with results. If normal and negative HPV, will repeat in 5 years.       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   ASCUS with positive high risk HPV cervical pap smear at [facility] 04/09/2018   Migraine with aura and with status migrainosus, not intractable 11/15/2013   Past Medical History:  Diagnosis Date   ASCUS with positive high risk HPV cervical pap smear at [facility] 04/09/2018   Migraine headache from childhood   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   Raynaud phenomenon 02/20/2016    Family History  Problem Relation Age of Onset   Healthy Mother    Healthy Father    Asthma Neg Hx    Cancer Neg Hx    Diabetes Neg Hx    Hypertension Neg Hx     Social History   Occupational History    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

## 2023-04-01 LAB — CYTOLOGY - PAP: Adequacy: ABNORMAL

## 2023-04-02 ENCOUNTER — Telehealth: Payer: Self-pay

## 2023-04-02 NOTE — Telephone Encounter (Signed)
I spoke with the pt and advised her PAP needs to be repeated given there were not enough cells for evaluation. Pt expressed understanding of this information and has accepted an appt for April 24th.

## 2023-04-14 ENCOUNTER — Encounter: Payer: Self-pay | Admitting: *Deleted

## 2023-04-23 ENCOUNTER — Other Ambulatory Visit: Payer: Self-pay | Admitting: Hematology and Oncology

## 2023-04-23 DIAGNOSIS — Z124 Encounter for screening for malignant neoplasm of cervix: Secondary | ICD-10-CM

## 2023-04-23 NOTE — Progress Notes (Signed)
Patient: Barbara Mcfarland           Date of Birth: 05-06-1991           MRN: [id number] Visit Date: 04/23/2023 PCP: Rema Fendt, NP  Cervical Cancer Screening Do you smoke?: No Have you ever had or been told you have an allergy to latex products?: No Marital status: Single Date of last pap smear: 1-2 yrs ago Date of last menstrual period:  (Implant) Number of pregnancies: 1 Number of births: 1 Have you ever had any of the following? Hysterectomy: No Tubal ligation (tubes tied): No Abnormal bleeding: No Abnormal pap smear: No Venereal warts: No A sex partner with venereal warts: No A high risk* sex partner: No  Cervical Exam Pap smear completed: Pap test Abnormal Observations: Normal exam. Recommendations: Will continue with annual pap smears due to HPV+ with presence of HPV 16/18/45.      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   ASCUS with positive high risk HPV cervical pap smear at [facility] 04/09/2018   Migraine with aura and with status migrainosus, not intractable 11/15/2013   Past Medical History:  Diagnosis Date   ASCUS with positive high risk HPV cervical pap smear at [facility] 04/09/2018   Migraine headache from childhood   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   Raynaud phenomenon 02/20/2016    Family History  Problem Relation Age of Onset   Healthy Mother    Healthy Father    Asthma Neg Hx    Cancer Neg Hx    Diabetes Neg Hx    Hypertension Neg Hx     Social History   Occupational History    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

## 2023-04-29 LAB — CYTOLOGY - PAP
Comment: NEGATIVE
Diagnosis: NEGATIVE
Diagnosis: REACTIVE
High risk HPV: NEGATIVE

## 2023-05-24 ENCOUNTER — Emergency Department (HOSPITAL_COMMUNITY)
Admission: EM | Admit: 2023-05-24 | Discharge: 2023-05-25 | Disposition: A | Discharge status: Home / Self Care | Payer: Self-pay | Attending: Emergency Medicine | Admitting: Emergency Medicine

## 2023-05-24 DIAGNOSIS — G43819 Other migraine, intractable, without status migrainosus: Secondary | ICD-10-CM | POA: Insufficient documentation

## 2023-05-24 NOTE — ED Triage Notes (Signed)
Presents from home for migraine, R frontal that feels "like a nail being driven through scalp" Migraine started Thursday night and evolved to include N/V, not relieved by tylenol or advil or homeopathic treatments.  H/o migraines that has lasted weeks in past Endorses photophobia, N/V Denies fever, neck stiffness

## 2023-05-25 ENCOUNTER — Encounter (HOSPITAL_COMMUNITY): Payer: Self-pay | Admitting: Emergency Medicine

## 2023-05-25 ENCOUNTER — Other Ambulatory Visit: Payer: Self-pay

## 2023-05-25 LAB — URINALYSIS, ROUTINE W REFLEX MICROSCOPIC
Bilirubin Urine: NEGATIVE
Glucose, UA: NEGATIVE mg/dL
Hgb urine dipstick: NEGATIVE
Ketones, ur: NEGATIVE mg/dL
Leukocytes,Ua: NEGATIVE
Nitrite: NEGATIVE
Protein, ur: NEGATIVE mg/dL
Specific Gravity, Urine: 1.018 (ref 1.005–1.030)
pH: 5 (ref 5.0–8.0)

## 2023-05-25 LAB — CBC WITH DIFFERENTIAL/PLATELET
Abs Immature Granulocytes: 0.01 10*3/uL (ref 0.00–0.07)
Basophils Absolute: 0 10*3/uL (ref 0.0–0.1)
Basophils Relative: 0 %
Eosinophils Absolute: 0.2 10*3/uL (ref 0.0–0.5)
Eosinophils Relative: 2 %
HCT: 41.3 % (ref 36.0–46.0)
Hemoglobin: 14.1 g/dL (ref 12.0–15.0)
Immature Granulocytes: 0 %
Lymphocytes Relative: 37 %
Lymphs Abs: 2.6 10*3/uL (ref 0.7–4.0)
MCH: 30.2 pg (ref 26.0–34.0)
MCHC: 34.1 g/dL (ref 30.0–36.0)
MCV: 88.4 fL (ref 80.0–100.0)
Monocytes Absolute: 0.5 10*3/uL (ref 0.1–1.0)
Monocytes Relative: 7 %
Neutro Abs: 3.7 10*3/uL (ref 1.7–7.7)
Neutrophils Relative %: 54 %
Platelets: 232 10*3/uL (ref 150–400)
RBC: 4.67 MIL/uL (ref 3.87–5.11)
RDW: 12 % (ref 11.5–15.5)
WBC: 6.9 10*3/uL (ref 4.0–10.5)
nRBC: 0 % (ref 0.0–0.2)

## 2023-05-25 LAB — COMPREHENSIVE METABOLIC PANEL
ALT: 20 U/L (ref 0–44)
AST: 16 U/L (ref 15–41)
Albumin: 4.1 g/dL (ref 3.5–5.0)
Alkaline Phosphatase: 77 U/L (ref 38–126)
Anion gap: 8 (ref 5–15)
BUN: 11 mg/dL (ref 6–20)
CO2: 25 mmol/L (ref 22–32)
Calcium: 8.9 mg/dL (ref 8.9–10.3)
Chloride: 104 mmol/L (ref 98–111)
Creatinine, Ser: 0.81 mg/dL (ref 0.44–1.00)
GFR, Estimated: >60 mL/min (ref >60)
Glucose, Bld: 111 mg/dL — ABNORMAL HIGH (ref 70–99)
Potassium: 3.7 mmol/L (ref 3.5–5.1)
Sodium: 137 mmol/L (ref 135–145)
Total Bilirubin: 0.8 mg/dL (ref 0.3–1.2)
Total Protein: 7.6 g/dL (ref 6.5–8.1)

## 2023-05-25 LAB — LIPASE, BLOOD: Lipase: 33 U/L (ref 11–51)

## 2023-05-25 LAB — PREGNANCY, URINE: Preg Test, Ur: NEGATIVE

## 2023-05-25 MED ORDER — RIZATRIPTAN BENZOATE 10 MG PO TBDP: 10.0000 mg | ORAL_TABLET | ORAL | 0 refills | Status: AC | PRN

## 2023-05-25 MED ORDER — ONDANSETRON 4 MG PO TBDP: 4.0000 mg | ORAL_TABLET | Freq: Three times a day (TID) | ORAL | 0 refills | Status: AC | PRN

## 2023-05-25 MED ORDER — LACTATED RINGERS IV BOLUS
1000.0000 mL | Freq: Once | INTRAVENOUS | Status: AC
  Administered 2023-05-25: 1000 mL via INTRAVENOUS

## 2023-05-25 MED ORDER — ONDANSETRON HCL 4 MG/2ML IJ SOLN
4.0000 mg | Freq: Once | INTRAMUSCULAR | Status: DC
  Filled 2023-05-25: qty 2

## 2023-05-25 MED ORDER — KETOROLAC TROMETHAMINE 30 MG/ML IJ SOLN
15.0000 mg | Freq: Once | INTRAMUSCULAR | Status: AC
  Administered 2023-05-25: 15 mg via INTRAVENOUS
  Filled 2023-05-25: qty 1

## 2023-05-25 MED ORDER — DIPHENHYDRAMINE HCL 50 MG/ML IJ SOLN: 25.0000 mg | Freq: Once | INTRAMUSCULAR | Status: DC | PRN

## 2023-05-25 MED ORDER — DIPHENHYDRAMINE HCL 50 MG/ML IJ SOLN
25.0000 mg | Freq: Once | INTRAMUSCULAR | Status: AC
  Administered 2023-05-25: 25 mg via INTRAVENOUS
  Filled 2023-05-25: qty 1

## 2023-05-25 MED ORDER — PROCHLORPERAZINE EDISYLATE 10 MG/2ML IJ SOLN
10.0000 mg | Freq: Once | INTRAMUSCULAR | Status: AC
  Administered 2023-05-25: 10 mg via INTRAVENOUS
  Filled 2023-05-25: qty 2

## 2023-05-25 MED ORDER — METHYLPREDNISOLONE SODIUM SUCC 125 MG IJ SOLR
125.0000 mg | Freq: Once | INTRAMUSCULAR | Status: AC
  Administered 2023-05-25: 125 mg via INTRAVENOUS
  Filled 2023-05-25: qty 2

## 2023-05-25 NOTE — ED Provider Notes (Signed)
[place] EMERGENCY DEPARTMENT AT [facility] Provider Note   CSN: [id number] Arrival date & time: 05/24/23  2333     History  Chief Complaint  Patient presents with   Headache    Barbara Mcfarland is a 32 y.o. female.  32 year old female history of migraines presents ER today secondary to migraine.  States that she used to have Maxalt but she does not have any anymore.  She does not have a primary doctor.  She states that this headache started Thursday night and just like all her other migraines.  She has no neck pain, fever, recent illnesses.  Some nausea some photophobia. No neuro changes. No trauma.   As I was leaving patient also asks to 'have my stomach checked out'.  She states that before the headache started she had some nausea and feels that there is a lump in her mid abdomen.  She has had some chills.  No diarrhea or constipation.   Headache      Home Medications Prior to Admission medications   Medication Sig Start Date End Date Taking? Authorizing Provider  rizatriptan (MAXALT-MLT) 10 MG disintegrating tablet Take 1 tablet (10 mg total) by mouth as needed for migraine. May repeat in 2 hours if needed 05/25/23  Yes Franke Menter, Barbara Cower, MD  etonogestrel (NEXPLANON) 68 MG IMPL implant 1 each by Subdermal route once. 07/05/21   [provider]  omeprazole (PRILOSEC) 20 MG capsule Take 1 capsule (20 mg total) by mouth daily. 07/08/22 08/07/22  Jeannie Fend, PA-C  ondansetron (ZOFRAN-ODT) 4 MG disintegrating tablet Take 1 tablet (4 mg total) by mouth every 8 (eight) hours as needed for nausea or vomiting. 05/25/23   Audreyanna Butkiewicz, Barbara Cower, MD      Allergies    Patient has no known allergies.    Review of Systems   Review of Systems  Neurological:  Positive for headaches.    Physical Exam Updated Vital Signs BP 118/78   Pulse 96   Temp 98.3 F (36.8 C) (Oral)   Resp 20   Wt 80.7 kg   SpO2 99%   BMI 28.73 kg/m  Physical Exam Vitals and nursing note  reviewed.  Constitutional:      Appearance: She is well-developed.  HENT:     Head: Normocephalic and atraumatic.  Cardiovascular:     Rate and Rhythm: Normal rate and regular rhythm.  Pulmonary:     Effort: No respiratory distress.     Breath sounds: No stridor.  Abdominal:     General: There is no distension.  Musculoskeletal:     Cervical back: Normal range of motion.  Neurological:     Mental Status: She is alert.     ED Results / Procedures / Treatments   Labs (all labs ordered are listed, but only abnormal results are displayed) Labs Reviewed  COMPREHENSIVE METABOLIC PANEL - Abnormal; Notable for the following components:      Result Value   Glucose, Bld 111 (*)    All other components within normal limits  URINALYSIS, ROUTINE W REFLEX MICROSCOPIC - Abnormal; Notable for the following components:   APPearance HAZY (*)    All other components within normal limits  CBC WITH DIFFERENTIAL/PLATELET  LIPASE, BLOOD  PREGNANCY, URINE    EKG None  Radiology No results found.  Procedures Procedures    Medications Ordered in ED Medications  diphenhydrAMINE (BENADRYL) injection 25 mg (has no administration in time range)  lactated ringers bolus 1,000 mL (0 mLs Intravenous  Stopped 05/25/23 0531)  ketorolac (TORADOL) 30 MG/ML injection 15 mg (15 mg Intravenous Given 05/25/23 0429)  diphenhydrAMINE (BENADRYL) injection 25 mg (25 mg Intravenous Given 05/25/23 0428)  prochlorperazine (COMPAZINE) injection 10 mg (10 mg Intravenous Given 05/25/23 0431)  methylPREDNISolone sodium succinate (SOLU-MEDROL) 125 mg/2 mL injection 125 mg (125 mg Intravenous Given 05/25/23 0432)    ED Course/ Medical Decision Making/ A&P                             Medical Decision Making Amount and/or Complexity of Data Reviewed Labs: ordered.  Risk Prescription drug management.  Patient drove herself here some hesitant to give a headache cocktail.  Specially since she is headache and  seizures in the past so would require more Benadryl likely.  No indication for CT as it seems like a simple migraine.  Will going check some basic labs for abdomen. Abdominal labs ok. Pain there improved with the headache meds, could be abdominal migraine? Will refill maxalt. Fu w/ neurology.   Final Clinical Impression(s) / ED Diagnoses Final diagnoses:  Other migraine without status migrainosus, intractable    Rx / DC Orders ED Discharge Orders          Ordered    rizatriptan (MAXALT-MLT) 10 MG disintegrating tablet  As needed        05/25/23 0610    ondansetron (ZOFRAN-ODT) 4 MG disintegrating tablet  Every 8 hours PRN        05/25/23 0610              Tavon Corriher, Barbara Cower, MD 05/25/23 [phone]

## 2024-03-03 ENCOUNTER — Ambulatory Visit: Payer: Self-pay | Admitting: Obstetrics and Gynecology

## 2024-03-03 ENCOUNTER — Encounter: Payer: Self-pay | Admitting: Obstetrics and Gynecology

## 2024-03-03 ENCOUNTER — Other Ambulatory Visit: Payer: Self-pay

## 2024-03-03 VITALS — BP 124/84 | HR 68 | Resp 100 | Ht 66.0 in | Wt 177.0 lb

## 2024-03-03 DIAGNOSIS — Z3169 Encounter for other general counseling and advice on procreation: Secondary | ICD-10-CM

## 2024-03-03 DIAGNOSIS — R109 Unspecified abdominal pain: Secondary | ICD-10-CM

## 2024-03-03 DIAGNOSIS — R1084 Generalized abdominal pain: Secondary | ICD-10-CM

## 2024-03-03 NOTE — Progress Notes (Signed)
 Pt stated--cramping, vomiting, bleeding 3 days--8 days

## 2024-03-03 NOTE — Progress Notes (Signed)
 NEW GYNECOLOGY PATIENT Patient name: Barbara Mcfarland MRN [id number]  Date of birth: 05-25-1991 Chief Complaint:   No chief complaint on file.     History:  Barbara Mcfarland is a 33 y.o. G1P1001 being seen today for abdominal pain, cramping and trying to conceive.    Had implant removed 4 months ago and trying to conceive followed by bleeding and has been having cramping.   Barely any spotting on the first and second day of the month and light bleeding on 2/27. Fairly regular menses since removal. Took pregnancy test at home yesterday that was negative.  Different person that she is trying to conceive with than who fathered prior pregnancy. Partner has 2 children. No pelvic infections since last pregnancy. No OPK use.  Having horrible cramping since period stopped as well as nausea with vomiting for 2 days; all symptoms started last Monday. Currently nauseous due to the pain of the cramping. Has not taken anything the cramping as it is intermittent. Has been feeling hot. No abnl d/c, no bowel changes. Previously used muscle relaxer and did not like how she felt while taking it.      Gynecologic History No LMP recorded. Patient has had an implant. Contraception: none Last Pap:     Component Value Date/Time   DIAGPAP  04/23/2023 1342    - Negative for Intraepithelial Lesions or Malignancy (NILM)   DIAGPAP - Benign reactive/reparative changes 04/23/2023 1342   DIAGPAP - Non-diagnostic (A) 03/26/2023 1326   HPVHIGH Negative 04/23/2023 1342   HPVHIGH Other 03/26/2023 1326   ADEQPAP  04/23/2023 1342    Satisfactory for evaluation; transformation zone component PRESENT.   ADEQPAP  03/26/2023 1326    UNSATISFACTORY for evaluation due to extremely scant cellularity. The   [facility]  03/26/2023 1326    specimen is processed and examined microscopically, but is found to be   ADEQPAP  03/26/2023 1326    unsatisfactory for evaluation of an epithelial abnormality. Repeat study   ADEQPAP  recommended. 03/26/2023 1326    High Risk HPV: Positive  Adequacy:  Satisfactory for evaluation, transformation zone component PRESENT  Diagnosis:  Atypical squamous cells of undetermined significance (ASC-US)  Last Mammogram: n/a Last Colonoscopy: n/a  Obstetric History OB History  Gravida Para Term Preterm AB Living  1 1 1   1   SAB IAB Ectopic Multiple Live Births     0 1    # Outcome Date GA Lbr Len/2nd Weight Sex Type Anes PTL Lv  1 Term 06/21/17 [redacted]w[redacted]d / 00:12 8 lb 5.2 oz (3.775 kg) F Vag-Spont None  LIV    Past Medical History:  Diagnosis Date   ASCUS with positive high risk HPV cervical pap smear at [facility] 04/09/2018   Migraine headache from childhood   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   Raynaud phenomenon 02/20/2016    Past Surgical History:  Procedure Laterality Date   NO PAST SURGERIES      Current Outpatient Medications on File Prior to Visit  Medication Sig Dispense Refill   etonogestrel (NEXPLANON) 68 MG IMPL implant 1 each by Subdermal route once. (Patient not taking: Reported on 03/03/2024)     omeprazole (PRILOSEC) 20 MG capsule Take 1 capsule (20 mg total) by mouth daily. 30 capsule 0   ondansetron (ZOFRAN-ODT) 4 MG disintegrating tablet Take 1 tablet (4 mg total) by mouth every 8 (eight) hours as needed for nausea or vomiting. (Patient not taking: Reported on 03/03/2024) 30 tablet 0  rizatriptan (MAXALT-MLT) 10 MG disintegrating tablet Take 1 tablet (10 mg total) by mouth as needed for migraine. May repeat in 2 hours if needed (Patient not taking: Reported on 03/03/2024) 10 tablet 0   No current facility-administered medications on file prior to visit.    No Known Allergies  Social History:  reports that she has never smoked. She has never been exposed to tobacco smoke. She has never used smokeless tobacco. She reports that she does not currently use drugs after having used the following drugs: Marijuana. She reports that she does not drink  alcohol.  Family History  Problem Relation Age of Onset   Healthy Mother    Healthy Father    Asthma Neg Hx    Cancer Neg Hx    Diabetes Neg Hx    Hypertension Neg Hx     The following portions of the patient's history were reviewed and updated as appropriate: allergies, current medications, past family history, past medical history, past social history, past surgical history and problem list.  Review of Systems Pertinent items noted in HPI and remainder of comprehensive ROS otherwise negative.  Physical Exam:  BP 124/84   Pulse 68   Resp (!) 100   Ht 5\' 6"  (1.676 m)   Wt 177 lb (80.3 kg)   LMP 02/26/2024   BMI 28.57 kg/m  Physical Exam Vitals and nursing note reviewed. Exam conducted with a chaperone present.  Constitutional:      Appearance: Normal appearance.  Pulmonary:     Effort: Pulmonary effort is normal.  Abdominal:     Palpations: Abdomen is soft.     Comments: + [place] with multiple trigger points  Genitourinary:    General: Normal vulva.     Exam position: Lithotomy position.  Neurological:     Mental Status: She is alert.     Abdominal Trigger Point Procedure Patient identified, informed consent performed, consent signed. The abdominal wall was examined and 10 points were identified and marked. Each site was cleaned with alcohol wipes. Each site was sprayed with hurricane spray and the needle introduced into the abdominal wall. Once the trigger point was confirmed to be entered by patient report of pain, 1cc of 1% lidocaine was injected and then pressure held at the injection site. The patient tolerated the procedure well and was given post procedure instructions.     Assessment and Plan:   1. Trigger point of abdomen (Primary) Now s/p uncomplicated trigger point injections.   2. Generalized abdominal pain Observe response to trigger point injections.   3. Infertility counseling - Reviewed three main factors for fertility - ovulation, normal sperm  and tubal patency - Ovulation: Use OPK to start.  - Tubal Patency: If all other testing is normal, we can check tubal patency via HSG. Reviewed what the procedure entails. - Reviewed factors that most behaviors do and don't contribute to infertility - Encouraged continued TTC during this time. Reviewed conception and timing of ovulation and timing of "trying" and to continue for at least another 6 months      Routine preventative health maintenance measures emphasized. Please refer to After Visit Summary for other counseling recommendations.   Follow-up: No follow-ups on file.      Lorriane Shire, MD Obstetrician & Gynecologist, Faculty Practice [facility] [employer], [facility]

## 2024-09-15 ENCOUNTER — Emergency Department (HOSPITAL_COMMUNITY)
Admission: EM | Admit: 2024-09-15 | Discharge: 2024-09-15 | Disposition: A | Discharge status: Home / Self Care | Payer: Self-pay | Attending: Emergency Medicine | Admitting: Emergency Medicine

## 2024-09-15 ENCOUNTER — Other Ambulatory Visit: Payer: Self-pay

## 2024-09-15 ENCOUNTER — Emergency Department (HOSPITAL_COMMUNITY): Payer: Self-pay

## 2024-09-15 DIAGNOSIS — S93492A Sprain of other ligament of left ankle, initial encounter: Secondary | ICD-10-CM | POA: Insufficient documentation

## 2024-09-15 DIAGNOSIS — X501XXA Overexertion from prolonged static or awkward postures, initial encounter: Secondary | ICD-10-CM | POA: Insufficient documentation

## 2024-09-15 DIAGNOSIS — S93402A Sprain of unspecified ligament of left ankle, initial encounter: Secondary | ICD-10-CM

## 2024-09-15 MED ORDER — IBUPROFEN 800 MG PO TABS
800.0000 mg | ORAL_TABLET | Freq: Once | ORAL | Status: AC
  Administered 2024-09-15: 800 mg via ORAL
  Filled 2024-09-15: qty 1

## 2024-09-15 NOTE — ED Triage Notes (Signed)
 Pt tripped on one step and twisted left ankle. Swelling noted. Painful to move. Tearful during triage.

## 2024-09-15 NOTE — ED Notes (Signed)
 Dc instructions reviewed will follow up with ortho

## 2024-09-15 NOTE — ED Provider Triage Note (Signed)
 Emergency Medicine Provider Triage Evaluation Note  Barbara Mcfarland , a 33 y.o. female  was evaluated in triage.  Pt complains of rolled left ankle taking the trash out today. Not able to bear weight afterwards. No other injuries.   Review of Systems  Positive:  Negative:   Physical Exam  BP 118/78   Pulse 83   Temp 98 F (36.7 C)   Resp 18   Wt 80.3 kg   SpO2 100%   BMI 28.57 kg/m  Gen:   Awake, no distress   Resp:  Normal effort  MSK:   Moves extremities without difficulty  Other:  TTP medial and lateral ankle. No pain prox fibula or 5th metatarsal.   Medical Decision Making  Medically screening exam initiated at 5:43 PM.  Appropriate orders placed.  Barbara Mcfarland was informed that the remainder of the evaluation will be completed by another provider, this initial triage assessment does not replace that evaluation, and the importance of remaining in the ED until their evaluation is complete.     Beverley Leita LABOR, PA-C 09/15/24 1744

## 2024-09-15 NOTE — ED Provider Notes (Signed)
 [place] EMERGENCY DEPARTMENT AT [facility] Provider Note   CSN: [id number] Arrival date & time: 09/15/24  1649     Patient presents with: Ankle Pain   Barbara Mcfarland is a 33 y.o. female.   34 y.o. female  was evaluated in triage.  Pt complains of rolled left ankle taking the trash out today. Not able to bear weight afterwards. No other injuries.        Prior to Admission medications   Medication Sig Start Date End Date Taking? Authorizing Provider  etonogestrel (NEXPLANON) 68 MG IMPL implant 1 each by Subdermal route once. Patient not taking: Reported on 03/03/2024 07/05/21   [provider]  omeprazole  (PRILOSEC) 20 MG capsule Take 1 capsule (20 mg total) by mouth daily. 07/08/22 08/07/22  Beverley Leita LABOR, PA-C  ondansetron  (ZOFRAN -ODT) 4 MG disintegrating tablet Take 1 tablet (4 mg total) by mouth every 8 (eight) hours as needed for nausea or vomiting. Patient not taking: Reported on 03/03/2024 05/25/23   Mesner, Selinda, MD  rizatriptan  (MAXALT -MLT) 10 MG disintegrating tablet Take 1 tablet (10 mg total) by mouth as needed for migraine. May repeat in 2 hours if needed Patient not taking: Reported on 03/03/2024 05/25/23   Mesner, Selinda, MD    Allergies: Patient has no known allergies.    Review of Systems Negative except as per HPI Updated Vital Signs BP 118/78   Pulse 83   Temp 98 F (36.7 C)   Resp 18   Wt 80.3 kg   LMP 09/15/2024   SpO2 100%   BMI 28.57 kg/m   Physical Exam Vitals and nursing note reviewed.  Constitutional:      General: She is not in acute distress.    Appearance: She is well-developed. She is not diaphoretic.  HENT:     Head: Normocephalic and atraumatic.  Cardiovascular:     Pulses: Normal pulses.  Pulmonary:     Effort: Pulmonary effort is normal.  Musculoskeletal:        General: Swelling and tenderness present. No deformity.     Left ankle: Tenderness present over the lateral malleolus and medial malleolus. No base  of 5th metatarsal or proximal fibula tenderness. Decreased range of motion.  Skin:    General: Skin is warm and dry.  Neurological:     Mental Status: She is alert and oriented to person, place, and time.  Psychiatric:        Behavior: Behavior normal.     (all labs ordered are listed, but only abnormal results are displayed) Labs Reviewed - No data to display  EKG: None  Radiology: DG Ankle Complete Left Result Date: 09/15/2024 CLINICAL DATA:  Recent trip and fall with ankle pain, initial encounter EXAM: LEFT ANKLE COMPLETE - 3+ VIEW COMPARISON:  None Available. FINDINGS: Lateral soft tissue swelling is noted. No acute fracture or dislocation is noted. IMPRESSION: Lateral soft tissue swelling without acute bony abnormality. Electronically Signed   By: Oneil Devonshire M.D.   On: 09/15/2024 19:16     Procedures   Medications Ordered in the ED  ibuprofen  (ADVIL ) tablet 800 mg (800 mg Oral Given 09/15/24 1750)                                    Medical Decision Making Amount and/or Complexity of Data Reviewed Radiology: ordered.  Risk Prescription drug management.   33 yo female with left ankle  injury, TTP medial and lateral ankle. DP pulse present, sensation intact, no TTP at proximal fibula or 5th metatarsal. XR of the left ankle as ordered and interpreted by myself as negative for acute bony abnormality. Agree with radiology interpretation.  DC with CAM and crutches, follow up with ortho if not improving. RICE, motrin /tylenol .      Final diagnoses:  Sprain of left ankle, unspecified ligament, initial encounter    ED Discharge Orders     None          Beverley Leita DELENA DEVONNA 09/15/24 2035    Armenta Canning, MD 09/19/24 2124

## 2024-09-15 NOTE — Discharge Instructions (Signed)
 Follow up with orthopedics in 1 week if not improving. Motrin  and Tylenol  as needed as directed. Elevate and ice for 20 minutes at a time as needed. Wear boot as needed, you can remove to sleep and bathe.

## 2024-09-15 NOTE — ED Notes (Signed)
 Ortho tech made aware of need for cam boot and crutches
